# Patient Record
Sex: Male | Born: 1965
Health system: Southern US, Community
[De-identification: ages and names within clinical notes are randomized; demographics above are authoritative.]

## PROBLEM LIST (undated history)

## (undated) DIAGNOSIS — T8859XA Other complications of anesthesia, initial encounter: Secondary | ICD-10-CM

## (undated) DIAGNOSIS — I83813 Varicose veins of bilateral lower extremities with pain: Secondary | ICD-10-CM

## (undated) DIAGNOSIS — Q250 Patent ductus arteriosus: Secondary | ICD-10-CM

## (undated) DIAGNOSIS — F329 Major depressive disorder, single episode, unspecified: Secondary | ICD-10-CM

## (undated) DIAGNOSIS — T4145XA Adverse effect of unspecified anesthetic, initial encounter: Secondary | ICD-10-CM

## (undated) DIAGNOSIS — Z972 Presence of dental prosthetic device (complete) (partial): Secondary | ICD-10-CM

## (undated) DIAGNOSIS — G459 Transient cerebral ischemic attack, unspecified: Secondary | ICD-10-CM

## (undated) DIAGNOSIS — T753XXA Motion sickness, initial encounter: Secondary | ICD-10-CM

## (undated) DIAGNOSIS — F32A Depression, unspecified: Secondary | ICD-10-CM

## (undated) HISTORY — PX: WISDOM TOOTH EXTRACTION: SHX21

---

## 2006-12-22 HISTORY — PX: COLONOSCOPY: SHX174

## 2011-07-23 ENCOUNTER — Ambulatory Visit: Payer: Self-pay | Admitting: Family Medicine

## 2011-07-28 ENCOUNTER — Ambulatory Visit: Payer: Self-pay | Admitting: Family Medicine

## 2012-08-25 ENCOUNTER — Emergency Department: Payer: Self-pay | Admitting: Emergency Medicine

## 2012-08-26 ENCOUNTER — Ambulatory Visit: Payer: Self-pay

## 2012-09-14 IMAGING — US US EXTREM LOW VENOUS*L*
1 series · 17 of 24 positions shown · non-contrast
Comparison: none

REASON FOR EXAM: CR [id number] left leg swelling and pain eval DVT
COMMENTS:

PROCEDURE:     PADAM - PADAM DOPPLER VEINS LT LEG EXTR  - July 28, 2011  [DATE]
RESULT:     The left femoral and popliteal veins are normally compressible.
The waveform patterns are normal and the color flow images are normal. The
response to the augmentation and Valsalva maneuvers is normal.

[Series 1: us extrem low venous*left* · 17 of 43 slices shown]
[im 1/43]
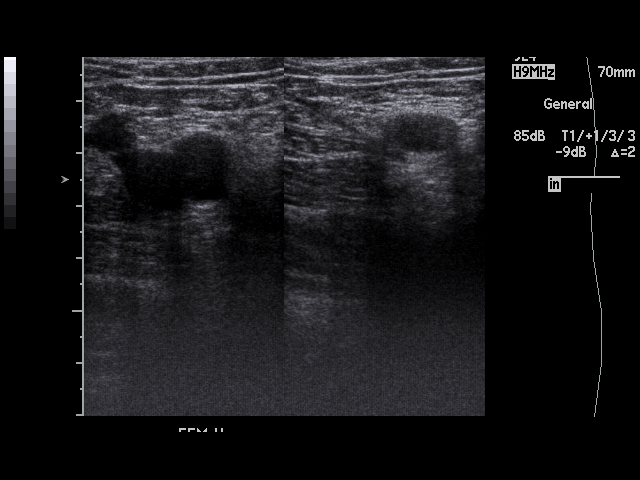
[im 4/43]
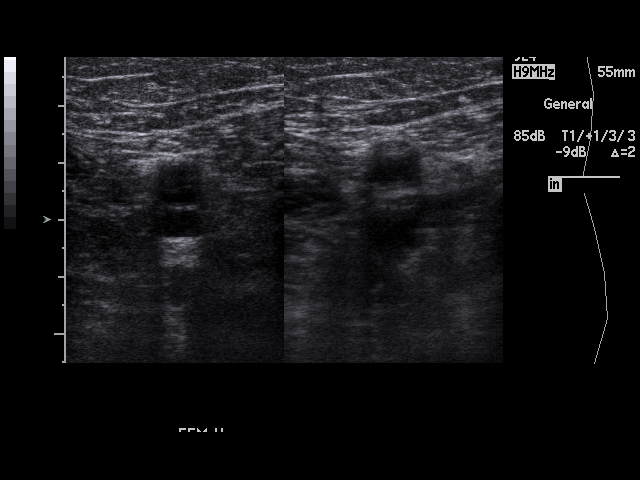
[im 6/43]
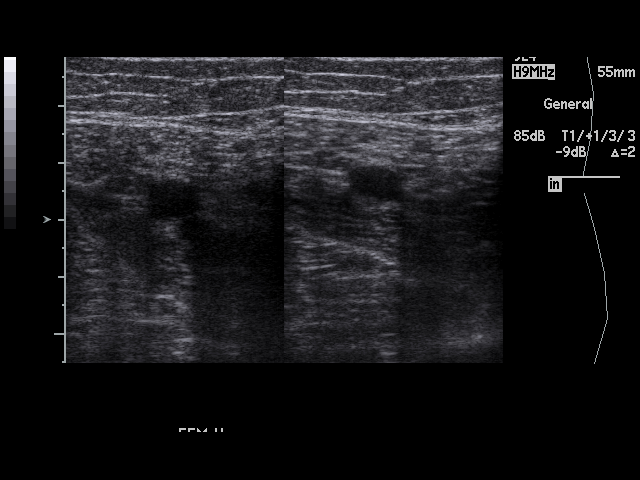
[im 8/43]
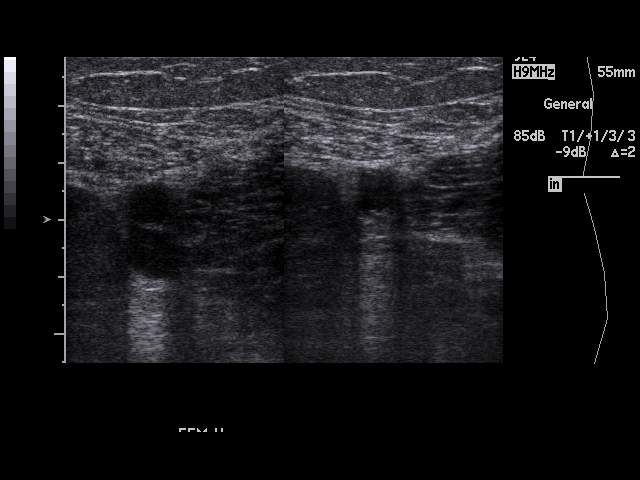
[im 11/43]
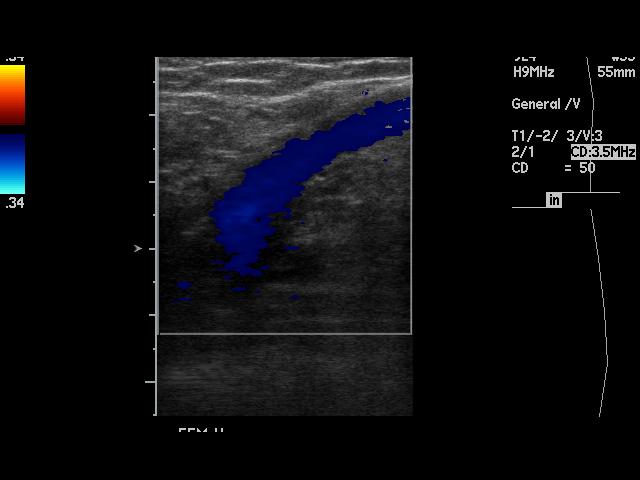
[im 13/43]
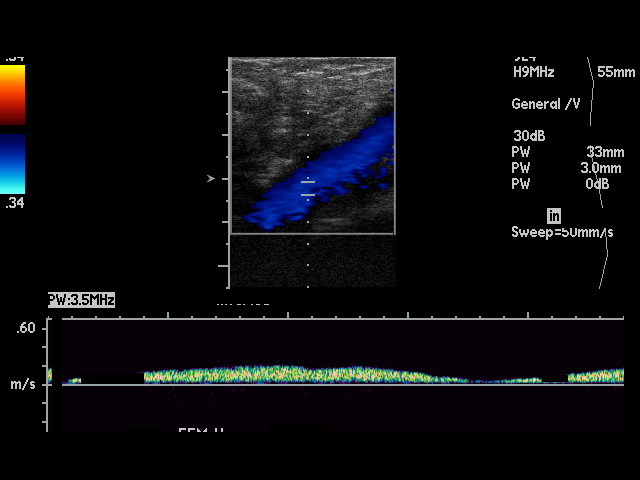
[im 17/43]
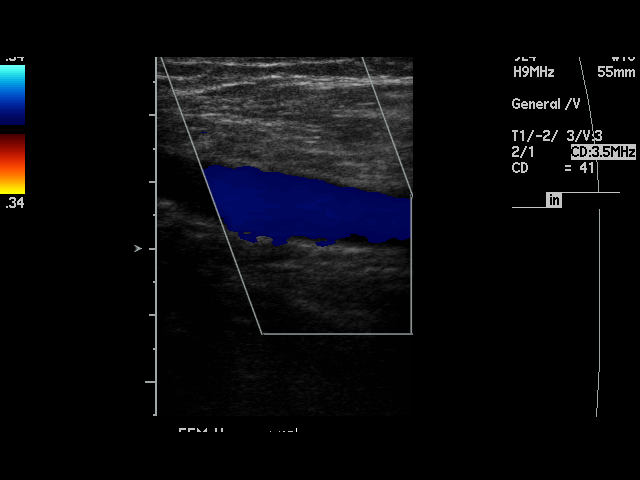
[im 19/43]
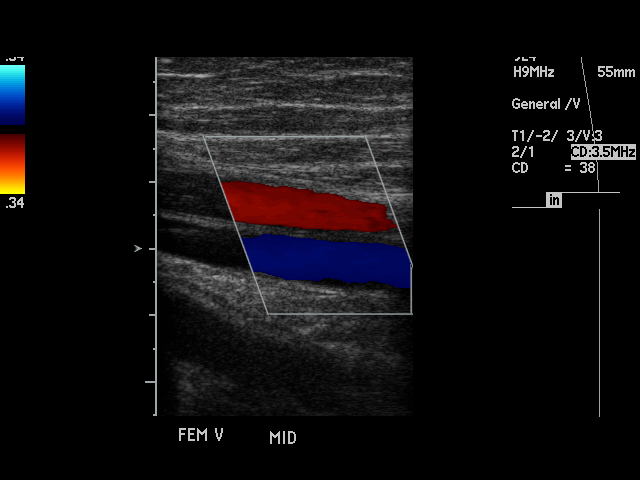
[im 22/43]
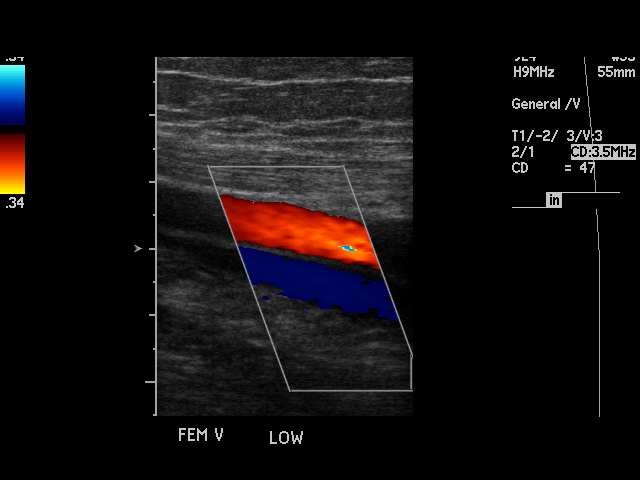
[im 24/43]
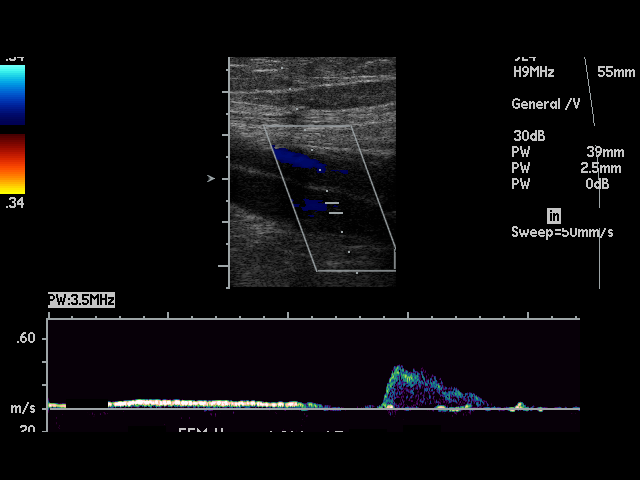
[im 26/43]
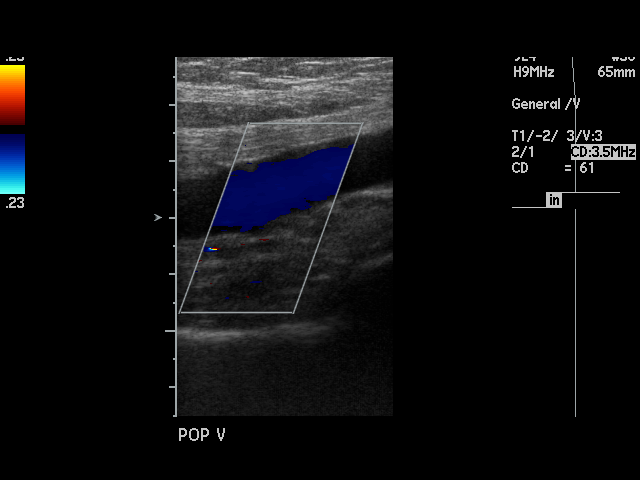
[im 30/43]
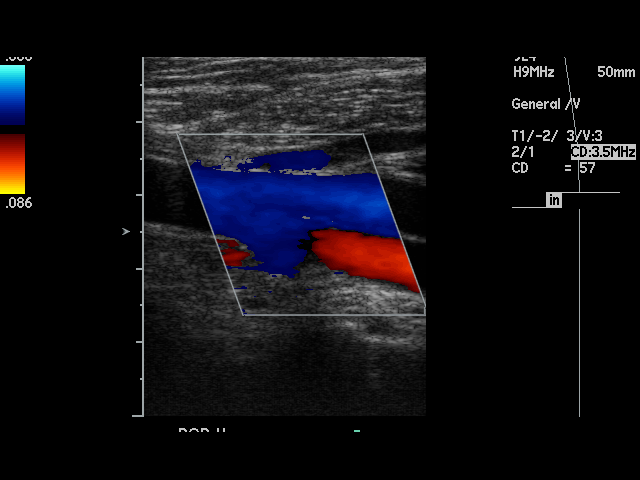
[im 32/43]
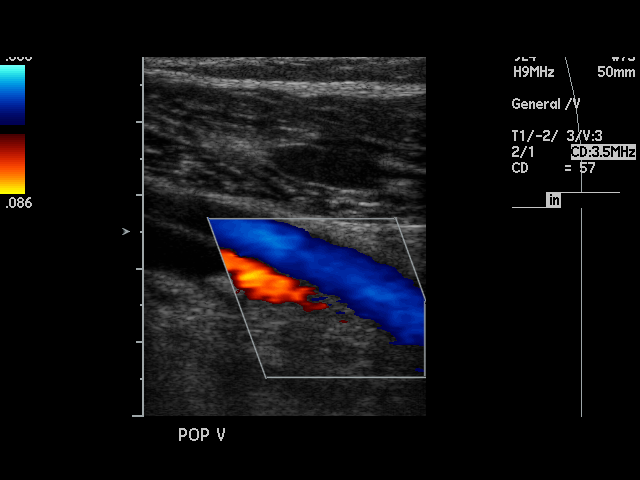
[im 35/43]
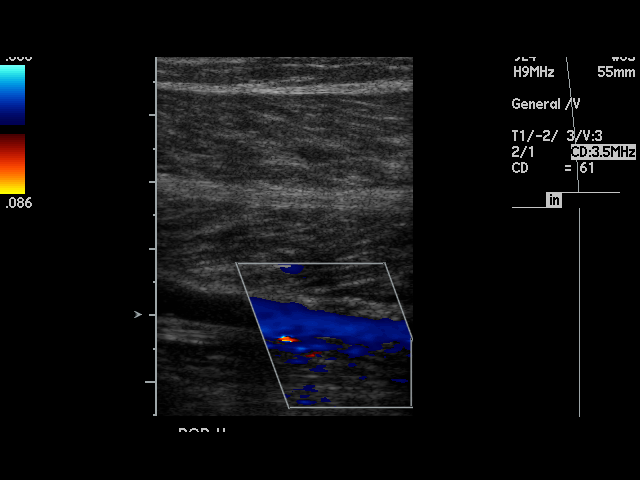
[im 37/43]
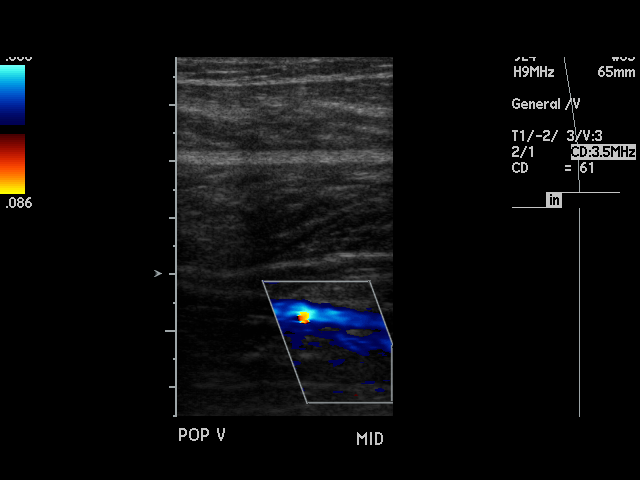
[im 39/43]
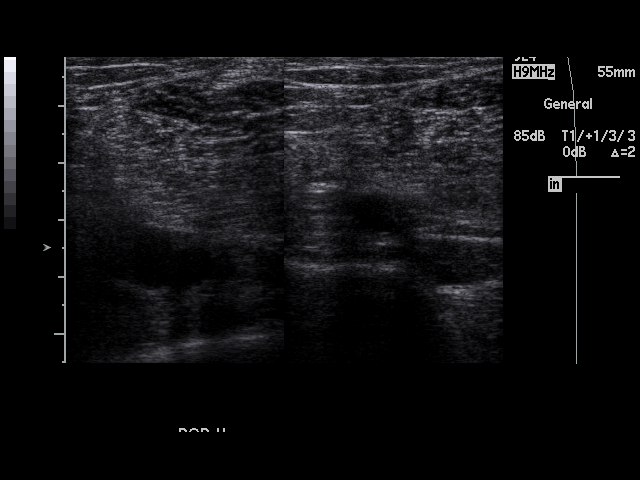
[im 43/43]
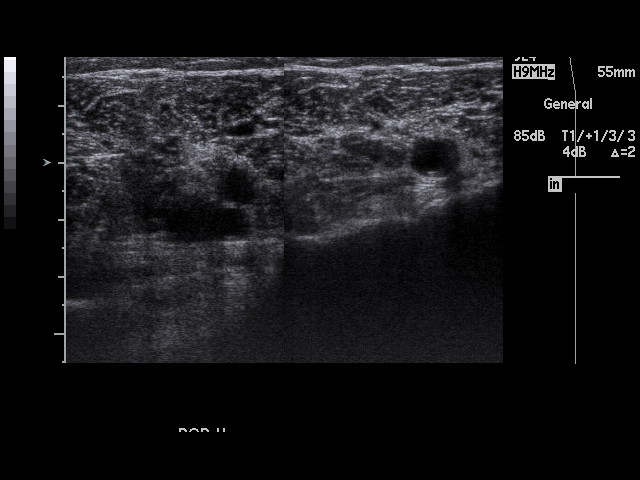

[17 of 24 positions shown; findings below may reference images not displayed]

IMPRESSION: I see no evidence of thrombus within the left femoral or
popliteal veins.

## 2013-09-15 ENCOUNTER — Ambulatory Visit: Payer: Self-pay | Admitting: Emergency Medicine

## 2015-06-06 DIAGNOSIS — F17201 Nicotine dependence, unspecified, in remission: Secondary | ICD-10-CM | POA: Insufficient documentation

## 2015-06-06 DIAGNOSIS — G459 Transient cerebral ischemic attack, unspecified: Secondary | ICD-10-CM | POA: Insufficient documentation

## 2015-06-06 DIAGNOSIS — F32A Depression, unspecified: Secondary | ICD-10-CM | POA: Insufficient documentation

## 2015-06-06 DIAGNOSIS — Q250 Patent ductus arteriosus: Secondary | ICD-10-CM | POA: Insufficient documentation

## 2015-06-06 DIAGNOSIS — I8000 Phlebitis and thrombophlebitis of superficial vessels of unspecified lower extremity: Secondary | ICD-10-CM | POA: Insufficient documentation

## 2015-06-06 DIAGNOSIS — I8393 Asymptomatic varicose veins of bilateral lower extremities: Secondary | ICD-10-CM | POA: Insufficient documentation

## 2015-06-06 DIAGNOSIS — F329 Major depressive disorder, single episode, unspecified: Secondary | ICD-10-CM | POA: Insufficient documentation

## 2015-06-06 HISTORY — DX: Patent ductus arteriosus: Q25.0

## 2015-06-08 ENCOUNTER — Encounter: Payer: Self-pay | Admitting: Internal Medicine

## 2015-06-08 ENCOUNTER — Ambulatory Visit (INDEPENDENT_AMBULATORY_CARE_PROVIDER_SITE_OTHER): Payer: BLUE CROSS/BLUE SHIELD | Admitting: Internal Medicine

## 2015-06-08 VITALS — BP 104/60 | HR 60 | Ht 73.0 in | Wt 192.2 lb

## 2015-06-08 DIAGNOSIS — F329 Major depressive disorder, single episode, unspecified: Secondary | ICD-10-CM | POA: Diagnosis not present

## 2015-06-08 DIAGNOSIS — F32A Depression, unspecified: Secondary | ICD-10-CM

## 2015-06-08 DIAGNOSIS — R5383 Other fatigue: Secondary | ICD-10-CM | POA: Diagnosis not present

## 2015-06-08 MED ORDER — ESCITALOPRAM OXALATE 10 MG PO TABS: 10.0000 mg | ORAL_TABLET | Freq: Every day | ORAL | Status: DC

## 2015-06-08 NOTE — Progress Notes (Signed)
Date:  06/08/2015   Name:  Bob Mills   DOB:  31-Dec-1965   MRN:  [id number]   Chief Complaint: Fatigue HPI - he has been feeling tired and fatigued for a while - at least the past several years.  He has depression and has been seeing a psychologist who thinks maybe he should take medication.  He goes to work but is missing out on social and family activities. He denies suicidal but has little interest.     Review of Systems:  Review of Systems  Constitutional: Positive for fatigue. Negative for fever, appetite change and unexpected weight change.  HENT: Negative for hearing loss and voice change.   Respiratory: Negative for chest tightness, shortness of breath and wheezing.   Cardiovascular: Positive for leg swelling. Negative for chest pain.  Neurological: Negative for dizziness, syncope, speech difficulty, numbness and headaches.  Hematological: Negative for adenopathy.  Psychiatric/Behavioral: Positive for dysphoric mood. Negative for suicidal ideas, hallucinations and confusion. The patient is not nervous/anxious.     Patient Active Problem List   Diagnosis Date Noted  . Compulsive tobacco user syndrome 06/06/2015  . Transient ischemic attack (TIA) 06/06/2015  . Varicose veins of both lower extremities 06/06/2015  . Patent arterial duct 06/06/2015  . Superficial thrombophlebitis of leg 06/06/2015    Prior to Admission medications   Medication Sig Start Date End Date Taking? Authorizing Provider  Multiple Vitamins-Minerals (MULTIVITAMIN GUMMIES ADULT) CHEW Chew 1 tablet by mouth daily.   Yes Historical Provider, MD  doxycycline (VIBRAMYCIN) 100 MG capsule Take by mouth.    Historical Provider, MD  naproxen (NAPROSYN) 500 MG tablet Take 1 tablet by mouth daily.    Historical Provider, MD    No Known Allergies  Past Surgical History  Procedure Laterality Date  . Colonoscopy  2008    History  Substance Use Topics  . Smoking status: Former Smoker    Quit date:  12/22/2013  . Smokeless tobacco: Former Systems developer    Quit date: 11/21/2013  . Alcohol Use: 0.0 oz/week    0 Standard drinks or equivalent per week     Medication list has been reviewed and updated.  Physical Examination:  Physical Exam  Constitutional: He appears well-developed and well-nourished.  Eyes: Conjunctivae are normal.  Neck: Normal range of motion. Neck supple. No thyromegaly present.  Cardiovascular: Normal rate and regular rhythm.   No murmur heard. Pulmonary/Chest: Breath sounds normal. He has no wheezes. He has no rales.  Musculoskeletal: He exhibits edema (varicose veins left lower leg) and tenderness.       Left lower leg: He exhibits swelling (varicose veins).  Lymphadenopathy:    He has no cervical adenopathy.  Neurological: He is alert.  Psychiatric: His speech is normal. Judgment and thought content normal. He is slowed. He exhibits a depressed mood.    BP 104/60 mmHg  Pulse 60  Ht 6\' 1"  (1.854 m)  Wt 192 lb 3.2 oz (87.181 kg)  BMI 25.36 kg/m2  Assessment and Plan: 1. Fatigue due to depression Rule out B12 deficiency - B12  2. Depression Continue counseling Begin SSRI - TSH - escitalopram (LEXAPRO) 10 MG tablet; Take 1 tablet (10 mg total) by mouth daily.  Dispense: 30 tablet; Refill: [employer], MD [facility]  06/08/2015

## 2015-06-09 LAB — TSH: TSH: 2.08 u[IU]/mL (ref 0.450–4.500)

## 2015-06-09 LAB — VITAMIN B12: VITAMIN B 12: 390 pg/mL (ref 211–946)

## 2015-07-17 ENCOUNTER — Encounter: Payer: Self-pay | Admitting: Internal Medicine

## 2015-07-18 ENCOUNTER — Ambulatory Visit: Payer: BLUE CROSS/BLUE SHIELD | Admitting: Internal Medicine

## 2015-09-10 ENCOUNTER — Ambulatory Visit (INDEPENDENT_AMBULATORY_CARE_PROVIDER_SITE_OTHER): Payer: BLUE CROSS/BLUE SHIELD | Admitting: Internal Medicine

## 2015-09-10 ENCOUNTER — Encounter: Payer: Self-pay | Admitting: Internal Medicine

## 2015-09-10 VITALS — BP 104/68 | HR 60 | Ht 73.0 in | Wt 198.0 lb

## 2015-09-10 DIAGNOSIS — F329 Major depressive disorder, single episode, unspecified: Secondary | ICD-10-CM

## 2015-09-10 DIAGNOSIS — Z0000 Encounter for general adult medical examination without abnormal findings: Secondary | ICD-10-CM | POA: Diagnosis not present

## 2015-09-10 DIAGNOSIS — G459 Transient cerebral ischemic attack, unspecified: Secondary | ICD-10-CM

## 2015-09-10 DIAGNOSIS — Z23 Encounter for immunization: Secondary | ICD-10-CM

## 2015-09-10 DIAGNOSIS — F17200 Nicotine dependence, unspecified, uncomplicated: Secondary | ICD-10-CM

## 2015-09-10 DIAGNOSIS — Z125 Encounter for screening for malignant neoplasm of prostate: Secondary | ICD-10-CM

## 2015-09-10 DIAGNOSIS — I8393 Asymptomatic varicose veins of bilateral lower extremities: Secondary | ICD-10-CM | POA: Diagnosis not present

## 2015-09-10 DIAGNOSIS — F32A Depression, unspecified: Secondary | ICD-10-CM

## 2015-09-10 LAB — POCT URINALYSIS DIPSTICK
BILIRUBIN UA: NEGATIVE
GLUCOSE UA: NEGATIVE
KETONES UA: NEGATIVE
Leukocytes, UA: NEGATIVE
NITRITE UA: NEGATIVE
PH UA: 5
Protein, UA: NEGATIVE
RBC UA: NEGATIVE
SPEC GRAV UA: 1.01
Urobilinogen, UA: 0.2

## 2015-09-10 MED ORDER — ESCITALOPRAM OXALATE 10 MG PO TABS: 10.0000 mg | ORAL_TABLET | Freq: Every day | ORAL | Status: DC

## 2015-09-10 NOTE — Progress Notes (Signed)
Date:  09/10/2015   Name:  Bob Mills   DOB:  06-04-1966   MRN:  [id number]   Chief Complaint: Annual Exam Bob Mills is a 49 y.o. male who presents today for his Complete Annual Exam. He feels well. He reports exercising none. He reports he is sleeping well. He still has issues with his varicose veins. Is contemplating surgery. Depression - doing well on current medications. He noticed when he was out of medication for one week he became more anxious and irritable.   Review of Systems:  Review of Systems  Constitutional: Negative for fever, chills, fatigue and unexpected weight change.  HENT: Negative for hearing loss, tinnitus and trouble swallowing.   Eyes: Negative for visual disturbance.  Respiratory: Negative for cough, shortness of breath and wheezing.   Cardiovascular: Positive for leg swelling. Negative for chest pain and palpitations.  Gastrointestinal: Negative for abdominal pain and constipation.  Genitourinary: Negative for dysuria, frequency, hematuria and difficulty urinating.  Musculoskeletal: Negative for myalgias, back pain and gait problem.       Tender varicose veins of the left lower extremity  Skin: Negative for color change, rash and wound.  Neurological: Negative for dizziness, tremors, syncope and headaches.  Hematological: Negative for adenopathy. Does not bruise/bleed easily.  Psychiatric/Behavioral: Negative for dysphoric mood and decreased concentration. The patient is not nervous/anxious and is not hyperactive.     Patient Active Problem List   Diagnosis Date Noted  . Compulsive tobacco user syndrome 06/06/2015  . Transient ischemic attack (TIA) 06/06/2015  . Varicose veins of both lower extremities 06/06/2015  . Patent arterial duct 06/06/2015  . Superficial thrombophlebitis of leg 06/06/2015  . Depression 06/06/2015    Prior to Admission medications   Medication Sig Start Date End Date Taking? Authorizing Provider  aspirin 81 MG  tablet Take 81 mg by mouth daily.   Yes Historical Provider, MD  escitalopram (LEXAPRO) 10 MG tablet Take 1 tablet (10 mg total) by mouth daily. 06/08/15  Yes Glean Hess, MD  Multiple Vitamins-Minerals (MULTIVITAMIN GUMMIES ADULT) CHEW Chew 1 tablet by mouth daily.   Yes Historical Provider, MD    No Known Allergies  Past Surgical History  Procedure Laterality Date  . Colonoscopy  2008    Social History  Substance Use Topics  . Smoking status: Former Smoker    Quit date: 12/22/2013  . Smokeless tobacco: Former Systems developer    Quit date: 11/21/2013  . Alcohol Use: 0.0 oz/week    0 Standard drinks or equivalent per week     Medication list has been reviewed and updated.  Physical Examination:  Physical Exam  Constitutional: He is oriented to person, place, and time. He appears well-developed and well-nourished. No distress.  HENT:  Head: Normocephalic and atraumatic.  Eyes: Conjunctivae are normal. Pupils are equal, round, and reactive to light. Right eye exhibits no discharge. Left eye exhibits no discharge. No scleral icterus.  Neck: Normal range of motion. Neck supple. No thyromegaly present.  Cardiovascular: Normal rate, regular rhythm and normal heart sounds.   Pulmonary/Chest: Effort normal and breath sounds normal. No respiratory distress. He has no rales.  Abdominal: Soft. Bowel sounds are normal. He exhibits no mass. There is no tenderness.  Musculoskeletal: Normal range of motion.  Extensive varicose veins with tenderness and mild swelling along the left lower extremity from the mid thigh down to the ankle. No infection or cord is noted.  Lymphadenopathy:    He has no cervical adenopathy.  He has no axillary adenopathy.  Neurological: He is alert and oriented to person, place, and time. He has normal reflexes.  Skin: Skin is warm and dry. No rash noted.  Psychiatric: He has a normal mood and affect. His behavior is normal. Thought content normal.    BP 104/68 mmHg   Pulse 60  Ht 6\' 1"  (1.854 m)  Wt 198 lb (89.812 kg)  BMI 26.13 kg/m2  Assessment and Plan: 1. Annual physical exam - POCT urinalysis dipstick  2. Prostate cancer screening - PSA  3. Depression Doing well on medication; recently failed a trial off so will continue indefinitely - escitalopram (LEXAPRO) 10 MG tablet; Take 1 tablet (10 mg total) by mouth daily.  Dispense: 30 tablet; Refill: 11  4. Compulsive tobacco user syndrome Remains tobacco free  5. Varicose veins of both lower extremities Patient is encouraged to consider surgery  6. Transient cerebral ischemia, unspecified transient cerebral ischemia type Stable, no recurrent symptoms, recommend continuing aspirin - CBC with Differential/Platelet - Comprehensive metabolic panel - Lipid panel  7. Flu vaccine need - Flu Vaccine QUAD 36+ mos PF IM (Fluarix & Fluzone Quad PF)   Halina Maidens, MD [facility]  09/10/2015

## 2015-09-11 LAB — CBC WITH DIFFERENTIAL/PLATELET
BASOS ABS: 0 10*3/uL (ref 0.0–0.2)
Basos: 1 %
EOS (ABSOLUTE): 0.1 10*3/uL (ref 0.0–0.4)
EOS: 2 %
HEMATOCRIT: 39 % (ref 37.5–51.0)
Hemoglobin: 13.2 g/dL (ref 12.6–17.7)
IMMATURE GRANS (ABS): 0 10*3/uL (ref 0.0–0.1)
Immature Granulocytes: 0 %
Lymphocytes Absolute: 2.7 10*3/uL (ref 0.7–3.1)
Lymphs: 35 %
MCH: 29.9 pg (ref 26.6–33.0)
MCHC: 33.8 g/dL (ref 31.5–35.7)
MCV: 88 fL (ref 79–97)
MONOCYTES: 9 %
Monocytes Absolute: 0.7 10*3/uL (ref 0.1–0.9)
Neutrophils Absolute: 4.2 10*3/uL (ref 1.4–7.0)
Neutrophils: 53 %
Platelets: 212 10*3/uL (ref 150–379)
RBC: 4.42 x10E6/uL (ref 4.14–5.80)
RDW: 13.1 % (ref 12.3–15.4)
WBC: 7.8 10*3/uL (ref 3.4–10.8)

## 2015-09-11 LAB — COMPREHENSIVE METABOLIC PANEL
ALT: 17 IU/L (ref 0–44)
AST: 17 IU/L (ref 0–40)
Albumin/Globulin Ratio: 1.6 (ref 1.1–2.5)
Albumin: 4.3 g/dL (ref 3.5–5.5)
Alkaline Phosphatase: 65 IU/L (ref 39–117)
BUN/Creatinine Ratio: 16 (ref 9–20)
BUN: 10 mg/dL (ref 6–24)
Bilirubin Total: 0.2 mg/dL (ref 0.0–1.2)
CALCIUM: 9.4 mg/dL (ref 8.7–10.2)
CO2: 25 mmol/L (ref 18–29)
CREATININE: 0.64 mg/dL — AB (ref 0.76–1.27)
Chloride: 99 mmol/L (ref 97–108)
GFR calc Af Amer: 135 mL/min/{1.73_m2} (ref >59)
GFR, EST NON AFRICAN AMERICAN: 117 mL/min/{1.73_m2} (ref >59)
GLUCOSE: 69 mg/dL (ref 65–99)
Globulin, Total: 2.7 g/dL (ref 1.5–4.5)
Potassium: 4.2 mmol/L (ref 3.5–5.2)
Sodium: 138 mmol/L (ref 134–144)
TOTAL PROTEIN: 7 g/dL (ref 6.0–8.5)

## 2015-09-11 LAB — LIPID PANEL
CHOL/HDL RATIO: 2.7 ratio (ref 0.0–5.0)
CHOLESTEROL TOTAL: 166 mg/dL (ref 100–199)
HDL: 62 mg/dL (ref >39)
LDL CALC: 79 mg/dL (ref 0–99)
Triglycerides: 127 mg/dL (ref 0–149)
VLDL Cholesterol Cal: 25 mg/dL (ref 5–40)

## 2015-09-11 LAB — PSA: Prostate Specific Ag, Serum: 0.3 ng/mL (ref 0.0–4.0)

## 2015-10-12 ENCOUNTER — Ambulatory Visit (INDEPENDENT_AMBULATORY_CARE_PROVIDER_SITE_OTHER): Payer: BLUE CROSS/BLUE SHIELD | Admitting: Internal Medicine

## 2015-10-12 ENCOUNTER — Ambulatory Visit
Admission: EM | Admit: 2015-10-12 | Discharge: 2015-10-12 | Disposition: A | Discharge status: Home / Self Care | Payer: BLUE CROSS/BLUE SHIELD | Attending: Emergency Medicine | Admitting: Emergency Medicine

## 2015-10-12 ENCOUNTER — Encounter: Payer: Self-pay | Admitting: Internal Medicine

## 2015-10-12 VITALS — BP 110/70 | HR 68 | Ht 73.0 in | Wt 199.4 lb

## 2015-10-12 DIAGNOSIS — I83892 Varicose veins of left lower extremities with other complications: Secondary | ICD-10-CM

## 2015-10-12 HISTORY — DX: Transient cerebral ischemic attack, unspecified: G45.9

## 2015-10-12 NOTE — Discharge Instructions (Signed)
Avoid scratching same area. Elevate legs. Use compression stockings as discussed.   Follow up with your primary care physician in 2-3 days for follow up. Return to Urgent care for new or worsening concerns.   Bleeding Varicose Veins Varicose veins are veins that have become enlarged and twisted. Valves in the veins help return blood from the leg to the heart. If these valves are damaged, blood flows backward and backs up into the veins in the leg near the skin. This causes the veins to become larger because of increased pressure within them. Sometimes these veins bleed. CAUSES  Factors that can lead to bleeding varicose veins include:  Thinning of the skin that covers the veins. This skin is stretched as the veins enlarge.  Weak and thinning walls of the varicose veins. These thin walls are part of the reason why blood is not flowing normally to the heart.  Having high pressure in the veins. This high pressure occurs because the blood is not flowing freely back up to the heart.  Injury. Even a small injury to a varicose vein can cause bleeding.  Open wounds. A sore may develop near a varicose vein and not heal. This makes bleeding more likely.  Taking medicine that thins the blood. These medicines may include aspirin, anti-inflammatory medicine, and other blood thinners. SIGNS AND SYMPTOMS  If bleeding is on the outside surface of the skin, blood can be seen. Sometimes, the bleeding stays under the skin. If this happens, the blue or purple area will spread beyond the vein. This discoloration may be visible. DIAGNOSIS  To decide if you have a bleeding varicose vein, your health care provider may:  Ask about your symptoms. This will include when you first saw bleeding.  Ask about how long you have had varicose veins and if they cause you problems.  Ask about your overall health.  Ask about possible causes, such as recent cuts or if the area near the varicose veins was bumped or  injured.  Examine the skin or leg that concerns you. Your health care provider will probably feel the veins.  Order imaging tests. These create detailed pictures of the veins. TREATMENT  The first goal of treating bleeding varicose veins is to stop the bleeding. Then, the aim is to keep any bleeding from happening again. Treatment will depend on the cause of the bleeding and how bad it is. Ask your health care provider about what would be best for you. Options include:  Raising (elevating) your leg. Lie down with your leg propped up on a pillow or cushion. Your foot should be above the level of your heart.  Applying pressure to the spot that is bleeding. The bleeding should stop in a short time.  Wearing elastic stockings that "compress" your legs (compression stockings). An elastic bandage may do the same thing.  Applying an antibiotic cream on sores that are not healing.  Closing off or surgically removing the bleeding varicose veins with one of the following:  Sclerotherapy. A solution is injected into the vein to close it off.  Laser treatment. A laser is used to heat the vein to close it off.  Radiofrequency vein ablation. An electrical current produced by radio waves is used to close off the vein.  Phlebectomy. The vein is surgically removed through small incisions made over the varicose vein.  Vein ligation and stripping. The vein is surgically removed through incisions made over the varicose vein after the vein has been tied (ligated). HOME  CARE INSTRUCTIONS   Apply any creams that your health care provider prescribed. Follow the directions carefully.  Wear compression stockings or any wraps as directed by your health care provider. Make sure you know:  If you should wear them every day.  How long you should wear them.  If veins were removed or closed, a bandage (dressing) will probably cover the area. Make sure you know:  How often the dressing should be  changed.  Whether the area can get wet.  When you can leave the skin uncovered.  Check your skin every day. Look for new sores and signs of bleeding.  To prevent future bleeding:  Use extra care in situations where you could cut your legs, such as when shaving or gardening.  Try to keep your legs elevated as much as possible. Lie down when you can. SEEK MEDICAL CARE IF:   Your veins continue to bleed.  You develop new sores near your varicose veins.  You have a sore that does not heal or gets bigger.  You have increased pain in your leg.  The area around a varicose vein becomes warm, red, or tender to the touch.  You notice a yellowish fluid that smells bad coming from a spot where there was bleeding.  You have a fever. SEEK IMMEDIATE MEDICAL CARE IF:   You have chest pain or difficulty breathing.  You have severe leg pain.   This information is not intended to replace advice given to you by your health care provider. Make sure you discuss any questions you have with your health care provider.   Document Released: 04/26/2009 Document Revised: 12/29/2014 Document Reviewed: 04/11/2014 Elsevier Interactive Patient Education [employer].

## 2015-10-12 NOTE — ED Notes (Signed)
Ambulatory to treatment room.  Bandage applied PTA.  No bleeding noted through bandage at this time.

## 2015-10-12 NOTE — ED Provider Notes (Signed)
[facility] Emergency Department Provider Note  ____________________________________________  Time seen: Approximately 4:26 PM  I have reviewed the triage vital signs and the nursing notes.   HISTORY  Chief Complaint Abrasion    HPI Bob Mills is a 49 y.o. male presents for complaint of left lower leg intermittent bleeding. Patient reports that he has chronic varicose veins and states Wednesday afternoon his leg was itching so he scratched his leg which accidentally scratched varicose pain. Patient states that it did begin to bleed however he applied pressure which stopped the bleeding. Patient reports that yesterday when he got out of the shower he forgot about the varicose vein and rubbed his leg dry with a towel scratching off the scab and cause the area to bleed mildly. Patient states that the area and then again stopped with pressure dressing. Patient reports that today he scratched left lower leg again which again caused the area to bleed. Patient states that he did follow up with his primary care physician today who then referred him to the urgent care. Denies leg or calf pain.   Patient states that the bleeding this afternoon occurred at approximately 12 noon. States the bleeding slowed down with him applying pressure. States his primary care physician's office put a dressing on the area which stopped the bleeding. Patient denies fall or trauma. Denies pain.States his primary doctor was concerned the area may need a stitch or cautery which is why he was referred to urgent care. Denies chest pain, shortness of breath, dizziness, fever, leg pain. States he occasionally has bilaterally leg swelling when he stands on his feet for long periods of time. Denies recent leg swelling.   Denies recent changes in medications. Denies blood thinners or anticoagulants. Reports occasionally takes daily 81 mg aspirin. Denies frequent NSAIDS use. Denies frequent ETOH  consumption.    Past Medical History  Diagnosis Date  . TIA (transient ischemic attack)     Patient Active Problem List   Diagnosis Date Noted  . Compulsive tobacco user syndrome 06/06/2015  . Transient ischemic attack (TIA) 06/06/2015  . Varicose veins of both lower extremities 06/06/2015  . Patent arterial duct 06/06/2015  . Superficial thrombophlebitis of leg 06/06/2015  . Depression 06/06/2015    Past Surgical History  Procedure Laterality Date  . Colonoscopy  2008    Current Outpatient Rx  Name  Route  Sig  Dispense  Refill  . aspirin 81 MG tablet   Oral   Take 81 mg by mouth daily.         Marland Kitchen escitalopram (LEXAPRO) 10 MG tablet   Oral   Take 1 tablet (10 mg total) by mouth daily.   30 tablet   11   . Multiple Vitamins-Minerals (MULTIVITAMIN GUMMIES ADULT) CHEW   Oral   Chew 1 tablet by mouth daily.           Allergies Review of patient's allergies indicates no known allergies.  Family History  Problem Relation Age of Onset  . Anemia Maternal Aunt   . Parkinson's disease Father     Social History Social History  Substance Use Topics  . Smoking status: Former Smoker    Quit date: 12/22/2013  . Smokeless tobacco: Former Systems developer    Quit date: 11/21/2013  . Alcohol Use: No    Review of Systems Constitutional: No fever/chills Eyes: No visual changes. ENT: No sore throat. Cardiovascular: Denies chest pain. Respiratory: Denies shortness of breath. Gastrointestinal: No abdominal pain.  No  nausea, no vomiting.  No diarrhea.  No constipation. Genitourinary: Negative for dysuria. Musculoskeletal: Negative for back pain. Skin: Negative for rash. Left lower leg bleeding.  Neurological: Negative for headaches, focal weakness or numbness.  10-point ROS otherwise negative.  ____________________________________________   PHYSICAL EXAM:  VITAL SIGNS: ED Triage Vitals  Enc Vitals Group     BP 10/12/15 1529 119/73 mmHg     Pulse Rate 10/12/15 1529  64     Resp 10/12/15 1529 16     Temp 10/12/15 1529 98.1 F (36.7 C)     Temp Source 10/12/15 1529 Oral     SpO2 10/12/15 1529 100 %     Weight --      Height --      Head Cir --      Peak Flow --      Pain Score 10/12/15 1532 0     Pain Loc --      Pain Edu? --      Excl. in [place]? --     Constitutional: Alert and oriented. Well appearing and in no acute distress. Eyes: Conjunctivae are normal. PERRL. EOMI. Head: Atraumatic.  Nose: No congestion/rhinnorhea.  Mouth/Throat: Mucous membranes are moist.  Oropharynx non-erythematous. Neck: No stridor.  No cervical spine tenderness to palpation. Hematological/Lymphatic/Immunilogical: No cervical lymphadenopathy. Cardiovascular: Normal rate, regular rhythm. Grossly normal heart sounds.  Good peripheral circulation. Respiratory: Normal respiratory effort.  No retractions. Lungs CTAB. Gastrointestinal: Soft and nontender. No distention. Normal Bowel sounds.   No CVA tenderness. Musculoskeletal: No lower or upper extremity tenderness nor edema.  No joint effusions. Bilateral pedal pulses equal and easily palpated. No calf tenderness bilaterally. No lower leg edema. Multiple superficial varicose veins bilaterally.  Neurologic:  Normal speech and language. No gross focal neurologic deficits are appreciated. No gait instability. Skin:  Skin is warm, dry and intact. No rash noted. Except: Left lower leg multiple superficial varicose veins present with left lateral lower leg superficial varicose vein with area of clotting, area small consistent with head of pen, no open wound, no active bleeding. Nontender. No erythema, no surrounding erythema. No edema.  Psychiatric: Mood and affect are normal. Speech and behavior are normal.  ____________________________________________   LABS (all labs ordered are listed, but only abnormal results are displayed)  Labs Reviewed - No data to display  PROCEDURES  Procedure(s) performed:  Procedure(s)  performed:  Procedure explained and verbal consent obtained. Consent: Verbal consent obtained. Written consent not obtained. Risks and benefits: risks, benefits and alternatives were discussed Patient identity confirmed: verbally with patient and hospital-assigned identification number  Consent given by: patient   Left lower lateral lower leg superficial varicose veins present with left lateral lower varicose vein with superficial pinpoint area clotting with fresh scabbing, consistent with patient report of area that was bleeding. No active bleeding at this time. As patient reports that this area has started bleeding again over last few days after scratching. Will use silver nitrate and perform superficial cauterization. Silver nitrate utilized. Patient tolerated well. No active bleeding. We'll continue to monitor in room to ensure no continued bleeding.    INITIAL IMPRESSION / ASSESSMENT AND PLAN / ED COURSE  Pertinent labs & imaging results that were available during my care of the patient were reviewed by me and considered in my medical decision making (see chart for details).  Very well appearing. No acute distress. Presents for bleeding superficial varicose vein after he accidentally scratched the area. No active bleeding at this time. Discussed  patient and plan of care with Dr. Vanita Panda. As patient only occasionally takes 81 mg baby aspirin, not on any blood thinners and bleeding was initiated by injury from patient scratching, do not feel that anticoagulant studies indicated at this time. Area cauterized with silver nitrate. Patient tolerated well. Monitored in urgent care for 30 minutes post cautery with patient walking and moving without any rebleeding. Patient denies any pain. Bilateral lower extremities nontender. Dressing applied. We'll also apply Ace bandage wrap to left lower leg for compression as well as to prevent area from being scratched. Discussed with patient to use TED stockings  as needed for varicose veins, patient reports he has these at home and occasionally uses them. Discussed monitoring the area for any signs of infection including redness, swelling, drainage, tenderness or worsening concerns. Also counseled to follow-up closely with his primary care physician.Discussed follow up with Primary care physician this week. Discussed follow up and return parameters including no resolution or any worsening concerns. Patient verbalized understanding and agreed to plan.   ____________________________________________   FINAL CLINICAL IMPRESSION(S) / ED DIAGNOSES  Final diagnoses:  Bleeding from varicose vein, left       Marylene Land, NP 10/12/15 2212

## 2015-10-12 NOTE — Progress Notes (Signed)
Date:  10/12/2015   Name:  Bob Mills   DOB:  [birth year]   MRN:  [id number]   Chief Complaint: Varicose Veins  Patient has a varicose vein on his left lower leg that continues to bleed. He thinks he scratched the area and started the problem. When necessary we will stop bleeding but then it begins again is here today to have it addressed.   Review of Systems  Cardiovascular: Positive for leg swelling (varicose veins).  Hematological: Bruises/bleeds easily.    Patient Active Problem List   Diagnosis Date Noted  . Compulsive tobacco user syndrome 06/06/2015  . Transient ischemic attack (TIA) 06/06/2015  . Varicose veins of both lower extremities 06/06/2015  . Patent arterial duct 06/06/2015  . Superficial thrombophlebitis of leg 06/06/2015  . Depression 06/06/2015    Prior to Admission medications   Medication Sig Start Date End Date Taking? Authorizing Provider  escitalopram (LEXAPRO) 10 MG tablet Take 1 tablet (10 mg total) by mouth daily. 09/10/15  Yes Glean Hess, MD  Multiple Vitamins-Minerals (MULTIVITAMIN GUMMIES ADULT) CHEW Chew 1 tablet by mouth daily.   Yes Historical Provider, MD  aspirin 81 MG tablet Take 81 mg by mouth daily.    Historical Provider, MD    No Known Allergies  Past Surgical History  Procedure Laterality Date  . Colonoscopy  2008    Social History  Substance Use Topics  . Smoking status: Former Smoker    Quit date: 12/22/2013  . Smokeless tobacco: Former Systems developer    Quit date: 11/21/2013  . Alcohol Use: 0.0 oz/week    0 Standard drinks or equivalent per week     Medication list has been reviewed and updated.   Physical Exam  Constitutional: He appears well-developed and well-nourished. No distress.  Pulmonary/Chest: Effort normal.  Skin:     Psychiatric: He has a normal mood and affect.    BP 110/70 mmHg  Pulse 68  Ht 6\' 1"  (1.854 m)  Wt 199 lb 6.4 oz (90.447 kg)  BMI 26.31 kg/m2  Assessment and Plan: 1. Bleeding from  varicose vein, left Pressure dressing applied to the bleeding site Patient sent downstairs to urgent care for further management   Halina Maidens, MD [facility]  10/12/2015

## 2015-10-12 NOTE — ED Notes (Signed)
Reports having vericose veins and scratched it Wednesday and it bled.  Was stopped by pressure.  Both yesterday and today area was scratched and bleeding was controlled by pressure bandages.  Saw his GP and was advised to come to [facility] to have it looked at.

## 2015-10-26 ENCOUNTER — Encounter: Payer: Self-pay | Admitting: Emergency Medicine

## 2015-10-26 ENCOUNTER — Ambulatory Visit
Admission: EM | Admit: 2015-10-26 | Discharge: 2015-10-26 | Disposition: A | Discharge status: Home / Self Care | Payer: BLUE CROSS/BLUE SHIELD | Attending: Family Medicine | Admitting: Family Medicine

## 2015-10-26 DIAGNOSIS — S81812A Laceration without foreign body, left lower leg, initial encounter: Secondary | ICD-10-CM | POA: Diagnosis not present

## 2015-10-26 MED ORDER — TETANUS-DIPHTH-ACELL PERTUSSIS 5-2.5-18.5 LF-MCG/0.5 IM SUSP
0.5000 mL | Freq: Once | INTRAMUSCULAR | Status: AC
  Administered 2015-10-26: 0.5 mL via INTRAMUSCULAR

## 2015-10-26 NOTE — Discharge Instructions (Signed)
Laceration Care, Adult  A laceration is a cut that goes through all layers of the skin. The cut also goes into the tissue that is right under the skin. Some cuts heal on their own. Others need to be closed with stitches (sutures), staples, skin adhesive strips, or wound glue. Taking care of your cut lowers your risk of infection and helps your cut to heal better.  HOW TO TAKE CARE OF YOUR CUT  For stitches or staples:  · Keep the wound clean and dry.  · If you were given a bandage (dressing), you should change it at least one time per day or as told by your doctor. You should also change it if it gets wet or dirty.  · Keep the wound completely dry for the first 24 hours or as told by your doctor. After that time, you may take a shower or a bath. However, make sure that the wound is not soaked in water until after the stitches or staples have been removed.  · Clean the wound one time each day or as told by your doctor:    Wash the wound with soap and water.    Rinse the wound with water until all of the soap comes off.    Pat the wound dry with a clean towel. Do not rub the wound.  · After you clean the wound, put a thin layer of antibiotic ointment on it as told by your doctor. This ointment:    Helps to prevent infection.    Keeps the bandage from sticking to the wound.  · Have your stitches or staples removed as told by your doctor.  If your doctor used skin adhesive strips:   · Keep the wound clean and dry.  · If you were given a bandage, you should change it at least one time per day or as told by your doctor. You should also change it if it gets dirty or wet.  · Do not get the skin adhesive strips wet. You can take a shower or a bath, but be careful to keep the wound dry.  · If the wound gets wet, pat it dry with a clean towel. Do not rub the wound.  · Skin adhesive strips fall off on their own. You can trim the strips as the wound heals. Do not remove any strips that are still stuck to the wound. They will  fall off after a while.  If your doctor used wound glue:  · Try to keep your wound dry, but you may briefly wet it in the shower or bath. Do not soak the wound in water, such as by swimming.  · After you take a shower or a bath, gently pat the wound dry with a clean towel. Do not rub the wound.  · Do not do any activities that will make you really sweaty until the skin glue has fallen off on its own.  · Do not apply liquid, cream, or ointment medicine to your wound while the skin glue is still on.  · If you were given a bandage, you should change it at least one time per day or as told by your doctor. You should also change it if it gets dirty or wet.  · If a bandage is placed over the wound, do not let the tape for the bandage touch the skin glue.  · Do not pick at the glue. The skin glue usually stays on for 5-10 days. Then, it   or when wound glue stays in place and the wound is healed. Make sure to wear a sunscreen of at least 30 SPF.  Take over-the-counter and prescription medicines only as told by your doctor.  If you were given antibiotic medicine or ointment, take or apply it as told by your doctor. Do not stop using the antibiotic even if your wound is getting better.  Do not scratch or pick at the wound.  Keep all follow-up visits as told by your doctor. This is important.  Check your wound every day for signs of infection. Watch for:  Redness, swelling, or pain.  Fluid, blood, or pus.  Raise (elevate) the injured area above the level of your heart while you are sitting or lying down, if possible. GET HELP IF:  You got a tetanus shot and you have any of these problems at the injection site:  Swelling.  Very bad pain.  Redness.  Bleeding.  You have a fever.  A wound that was  closed breaks open.  You notice a bad smell coming from your wound or your bandage.  You notice something coming out of the wound, such as wood or glass.  Medicine does not help your pain.  You have more redness, swelling, or pain at the site of your wound.  You have fluid, blood, or pus coming from your wound.  You notice a change in the color of your skin near your wound.  You need to change the bandage often because fluid, blood, or pus is coming from the wound.  You start to have a new rash.  You start to have numbness around the wound. GET HELP RIGHT AWAY IF:  You have very bad swelling around the wound.  Your pain suddenly gets worse and is very bad.  You notice painful lumps near the wound or on skin that is anywhere on your body.  You have a red streak going away from your wound.  The wound is on your hand or foot and you cannot move a finger or toe like you usually can.  The wound is on your hand or foot and you notice that your fingers or toes look pale or bluish.   This information is not intended to replace advice given to you by your health care provider. Make sure you discuss any questions you have with your health care provider.   Document Released: 05/26/2008 Document Revised: 04/24/2015 Document Reviewed: 12/04/2014 Elsevier Interactive Patient Education 2016 [place].   Follow up 7-10 days for suture removal or sooner if signs of infection

## 2015-10-26 NOTE — ED Provider Notes (Signed)
CSN: [id number]     Arrival date & time 10/26/15  1000 History   First MD Initiated Contact with Patient 10/26/15 1128     Chief Complaint  Patient presents with  . Laceration   (Consider location/radiation/quality/duration/timing/severity/associated sxs/prior Treatment) Patient is a 49 y.o. male presenting with skin laceration. The history is provided by the patient.  Laceration Location:  Leg Leg laceration location:  L lower leg (posterior left lower leg; over the achilles tendon area) Depth:  Through dermis Quality: straight   Bleeding: controlled   Time since incident:  12 hours Laceration mechanism:  Broken glass (states he dropped a glass bowl that shattered and a broken piece cut his leg)   Past Medical History  Diagnosis Date  . TIA (transient ischemic attack)    Past Surgical History  Procedure Laterality Date  . Colonoscopy  2008   Family History  Problem Relation Age of Onset  . Anemia Maternal Aunt   . Parkinson's disease Father    Social History  Substance Use Topics  . Smoking status: Former Smoker    Quit date: 12/22/2013  . Smokeless tobacco: Former Systems developer    Quit date: 11/21/2013  . Alcohol Use: No    Review of Systems  Allergies  Review of patient's allergies indicates no known allergies.  Home Medications   Prior to Admission medications   Medication Sig Start Date End Date Taking? Authorizing Provider  aspirin 81 MG tablet Take 81 mg by mouth daily.    Historical Provider, MD  escitalopram (LEXAPRO) 10 MG tablet Take 1 tablet (10 mg total) by mouth daily. 09/10/15   Glean Hess, MD  Multiple Vitamins-Minerals (MULTIVITAMIN GUMMIES ADULT) CHEW Chew 1 tablet by mouth daily.    Historical Provider, MD   Meds Ordered and Administered this Visit   Medications  Tdap (BOOSTRIX) injection 0.5 mL (0.5 mLs Intramuscular Given 10/26/15 1042)    BP 107/62 mmHg  Pulse 66  Temp(Src) 98 F (36.7 C) (Tympanic)  Resp 20  Ht 6\' 1"  (1.854 m)  Wt  198 lb (89.812 kg)  BMI 26.13 kg/m2  SpO2 99% No data found.   Physical Exam  Constitutional: He appears well-developed and well-nourished. No distress.  Musculoskeletal:  Normal range of motion of ankle/foot  Skin: He is not diaphoretic.  Left lower extremity neurovascularly intact; 2cm left lower posterior leg skin laceration; does not involved tendon; normal range of motion of foot/leg  Nursing note and vitals reviewed.   ED Course  Procedures (including critical care time)  Labs Review Labs Reviewed - No data to display  Imaging Review No results found.   Visual Acuity Review  Right Eye Distance:   Left Eye Distance:   Bilateral Distance:    Right Eye Near:   Left Eye Near:    Bilateral Near:         MDM   1. Laceration of leg, left, initial encounter    1.  diagnosis reviewed with patient 2.  Area cleaned and prepped in sterile fashion; wound anesthetized with 1% lidocaine; wound examined and an approximately 2-47mm foreign body (piece of broken glass) visualized at the edge of the wound and removed; no other foreign bodies visualized; 3 interrupted sutures placed with 5.0 Nylon; patient tolerated procedure well; wound dressed 3. Wound/laceration care instructions given 4. Follow-up in 7-10 days or sooner prn if any problems     Norval Gable, MD 10/26/15 1212

## 2015-10-26 NOTE — ED Notes (Signed)
Pt cut left lower leg on a broken bowl last pm

## 2015-11-08 DIAGNOSIS — I83813 Varicose veins of bilateral lower extremities with pain: Secondary | ICD-10-CM | POA: Insufficient documentation

## 2016-03-22 HISTORY — PX: VASCULAR SURGERY: SHX849

## 2016-09-16 ENCOUNTER — Other Ambulatory Visit: Payer: Self-pay | Admitting: Internal Medicine

## 2016-09-16 DIAGNOSIS — F329 Major depressive disorder, single episode, unspecified: Secondary | ICD-10-CM

## 2016-09-16 DIAGNOSIS — F32A Depression, unspecified: Secondary | ICD-10-CM

## 2016-09-18 NOTE — Telephone Encounter (Signed)
Patient is scheduled on 2/9 @ 830 for CPE added to cancellation list

## 2016-11-27 ENCOUNTER — Encounter: Payer: Self-pay | Admitting: Internal Medicine

## 2016-11-27 ENCOUNTER — Ambulatory Visit (INDEPENDENT_AMBULATORY_CARE_PROVIDER_SITE_OTHER): Payer: BLUE CROSS/BLUE SHIELD | Admitting: Internal Medicine

## 2016-11-27 VITALS — BP 116/76 | HR 67 | Resp 16 | Ht 73.0 in | Wt 199.0 lb

## 2016-11-27 DIAGNOSIS — Z125 Encounter for screening for malignant neoplasm of prostate: Secondary | ICD-10-CM | POA: Diagnosis not present

## 2016-11-27 DIAGNOSIS — Z0000 Encounter for general adult medical examination without abnormal findings: Secondary | ICD-10-CM

## 2016-11-27 DIAGNOSIS — G458 Other transient cerebral ischemic attacks and related syndromes: Secondary | ICD-10-CM | POA: Diagnosis not present

## 2016-11-27 DIAGNOSIS — I83813 Varicose veins of bilateral lower extremities with pain: Secondary | ICD-10-CM | POA: Diagnosis not present

## 2016-11-27 DIAGNOSIS — F320 Major depressive disorder, single episode, mild: Secondary | ICD-10-CM

## 2016-11-27 LAB — POCT URINALYSIS DIPSTICK
BILIRUBIN UA: NEGATIVE
Glucose, UA: NEGATIVE
KETONES UA: NEGATIVE
Leukocytes, UA: NEGATIVE
NITRITE UA: NEGATIVE
PH UA: 6
PROTEIN UA: NEGATIVE
RBC UA: NEGATIVE
Spec Grav, UA: 1.01
Urobilinogen, UA: 0.2

## 2016-11-27 NOTE — Patient Instructions (Addendum)
Begin regular exercise 30-45 minutes 4-5 times per week recommended  Health Maintenance, Male A healthy lifestyle and preventative care can promote health and wellness.  Maintain regular health, dental, and eye exams.  Eat a healthy diet. Foods like vegetables, fruits, whole grains, low-fat dairy products, and lean protein foods contain the nutrients you need and are low in calories. Decrease your intake of foods high in solid fats, added sugars, and salt. Get information about a proper diet from your health care provider, if necessary.  Regular physical exercise is one of the most important things you can do for your health. Most adults should get at least 150 minutes of moderate-intensity exercise (any activity that increases your heart rate and causes you to sweat) each week. In addition, most adults need muscle-strengthening exercises on 2 or more days a week.   Maintain a healthy weight. The body mass index (BMI) is a screening tool to identify possible weight problems. It provides an estimate of body fat based on height and weight. Your health care provider can find your BMI and can help you achieve or maintain a healthy weight. For males 20 years and older:  A BMI below 18.5 is considered underweight.  A BMI of 18.5 to 24.9 is normal.  A BMI of 25 to 29.9 is considered overweight.  A BMI of 30 and above is considered obese.  Maintain normal blood lipids and cholesterol by exercising and minimizing your intake of saturated fat. Eat a balanced diet with plenty of fruits and vegetables. Blood tests for lipids and cholesterol should begin at age 18 and be repeated every 5 years. If your lipid or cholesterol levels are high, you are over age 64, or you are at high risk for heart disease, you may need your cholesterol levels checked more frequently.Ongoing high lipid and cholesterol levels should be treated with medicines if diet and exercise are not working.  If you smoke, find out from  your health care provider how to quit. If you do not use tobacco, do not start.  Lung cancer screening is recommended for adults aged 85-80 years who are at high risk for developing lung cancer because of a history of smoking. A yearly low-dose CT scan of the lungs is recommended for people who have at least a 30-pack-year history of smoking and are current smokers or have quit within the past 15 years. A pack year of smoking is smoking an average of 1 pack of cigarettes a day for 1 year (for example, a 30-pack-year history of smoking could mean smoking 1 pack a day for 30 years or 2 packs a day for 15 years). Yearly screening should continue until the smoker has stopped smoking for at least 15 years. Yearly screening should be stopped for people who develop a health problem that would prevent them from having lung cancer treatment.  If you choose to drink alcohol, do not have more than 2 drinks per day. One drink is considered to be 12 oz (360 mL) of beer, 5 oz (150 mL) of wine, or 1.5 oz (45 mL) of liquor.  Avoid the use of street drugs. Do not share needles with anyone. Ask for help if you need support or instructions about stopping the use of drugs.  High blood pressure causes heart disease and increases the risk of stroke. High blood pressure is more likely to develop in:  People who have blood pressure in the end of the normal range (100-139/85-89 mm Hg).  People who  are overweight or obese.  People who are African American.  If you are 24-61 years of age, have your blood pressure checked every 3-5 years. If you are 80 years of age or older, have your blood pressure checked every year. You should have your blood pressure measured twice-once when you are at a hospital or clinic, and once when you are not at a hospital or clinic. Record the average of the two measurements. To check your blood pressure when you are not at a hospital or clinic, you can use:  An automated blood pressure machine at  a pharmacy.  A home blood pressure monitor.  If you are 69-28 years old, ask your health care provider if you should take aspirin to prevent heart disease.  Diabetes screening involves taking a blood sample to check your fasting blood sugar level. This should be done once every 3 years after age 48 if you are at a normal weight and without risk factors for diabetes. Testing should be considered at a younger age or be carried out more frequently if you are overweight and have at least 1 risk factor for diabetes.  Colorectal cancer can be detected and often prevented. Most routine colorectal cancer screening begins at the age of 26 and continues through age 21. However, your health care provider may recommend screening at an earlier age if you have risk factors for colon cancer. On a yearly basis, your health care provider may provide home test kits to check for hidden blood in the stool. A small camera at the end of a tube may be used to directly examine the colon (sigmoidoscopy or colonoscopy) to detect the earliest forms of colorectal cancer. Talk to your health care provider about this at age 25 when routine screening begins. A direct exam of the colon should be repeated every 5-10 years through age 4, unless early forms of precancerous polyps or small growths are found.  People who are at an increased risk for hepatitis B should be screened for this virus. You are considered at high risk for hepatitis B if:  You were born in a country where hepatitis B occurs often. Talk with your health care provider about which countries are considered high risk.  Your parents were born in a high-risk country and you have not received a shot to protect against hepatitis B (hepatitis B vaccine).  You have HIV or AIDS.  You use needles to inject street drugs.  You live with, or have sex with, someone who has hepatitis B.  You are a man who has sex with other men (MSM).  You get hemodialysis  treatment.  You take certain medicines for conditions like cancer, organ transplantation, and autoimmune conditions.  Hepatitis C blood testing is recommended for all people born from [birth year] through 1964 and any individual with known risk factors for hepatitis C.  Healthy men should no longer receive prostate-specific antigen (PSA) blood tests as part of routine cancer screening. Talk to your health care provider about prostate cancer screening.  Testicular cancer screening is not recommended for adolescents or adult males who have no symptoms. Screening includes self-exam, a health care provider exam, and other screening tests. Consult with your health care provider about any symptoms you have or any concerns you have about testicular cancer.  Practice safe sex. Use condoms and avoid high-risk sexual practices to reduce the spread of sexually transmitted infections (STIs).  You should be screened for STIs, including gonorrhea and chlamydia if:  You  are sexually active and are younger than 24 years.  You are older than 24 years, and your health care provider tells you that you are at risk for this type of infection.  Your sexual activity has changed since you were last screened, and you are at an increased risk for chlamydia or gonorrhea. Ask your health care provider if you are at risk.  If you are at risk of being infected with HIV, it is recommended that you take a prescription medicine daily to prevent HIV infection. This is called pre-exposure prophylaxis (PrEP). You are considered at risk if:  You are a man who has sex with other men (MSM).  You are a heterosexual man who is sexually active with multiple partners.  You take drugs by injection.  You are sexually active with a partner who has HIV.  Talk with your health care provider about whether you are at high risk of being infected with HIV. If you choose to begin PrEP, you should first be tested for HIV. You should then be tested  every 3 months for as long as you are taking PrEP.  Use sunscreen. Apply sunscreen liberally and repeatedly throughout the day. You should seek shade when your shadow is shorter than you. Protect yourself by wearing long sleeves, pants, a wide-brimmed hat, and sunglasses year round whenever you are outdoors.  Tell your health care provider of new moles or changes in moles, especially if there is a change in shape or color. Also, tell your health care provider if a mole is larger than the size of a pencil eraser.  A one-time screening for abdominal aortic aneurysm (AAA) and surgical repair of large AAAs by ultrasound is recommended for men aged 51-75 years who are current or former smokers.  Stay current with your vaccines (immunizations). This information is not intended to replace advice given to you by your health care provider. Make sure you discuss any questions you have with your health care provider. Document Released: 06/05/2008 Document Revised: 12/29/2014 Document Reviewed: 09/11/2015 Elsevier Interactive Patient Education  2017 [employer].

## 2016-11-27 NOTE — Progress Notes (Signed)
Date:  11/27/2016   Name:  Bob Mills   DOB:  01-02-1966   MRN:  [id number]   Chief Complaint: Annual Exam Bob Mills is a 50 y.o. male who presents today for his Complete Annual Exam. He feels well. He reports not exercising regularly. He reports he is sleeping well.  Since he was here last he had vein surgery on the left and it is improved with no more bleeding. His older sister recently had colonoscopy with multiple polyps and they recommended that her siblings have colonoscopies. He has had no further TIA sx since 2006.  Etiology was attributed to PDA - no repair recommended.  Review of Systems  Constitutional: Negative for appetite change, chills, diaphoresis, fatigue and unexpected weight change.  HENT: Negative for hearing loss, tinnitus, trouble swallowing and voice change.   Eyes: Negative for visual disturbance.  Respiratory: Negative for choking, shortness of breath and wheezing.   Cardiovascular: Negative for chest pain, palpitations and leg swelling.  Gastrointestinal: Negative for abdominal pain, blood in stool, constipation and diarrhea.  Genitourinary: Negative for difficulty urinating, dysuria and frequency.  Musculoskeletal: Negative for arthralgias, back pain and myalgias.  Skin: Negative for color change and rash.  Neurological: Negative for dizziness, syncope and headaches.  Hematological: Negative for adenopathy.  Psychiatric/Behavioral: Negative for dysphoric mood and sleep disturbance.    Patient Active Problem List   Diagnosis Date Noted  . Mild single current episode of major depressive disorder ([place]) 11/27/2016  . Varicose veins of both lower extremities with pain 11/08/2015  . Compulsive tobacco user syndrome 06/06/2015  . Transient ischemic attack (TIA) 06/06/2015  . Patent arterial duct 06/06/2015  . Superficial thrombophlebitis of leg 06/06/2015  . Depression 06/06/2015    Prior to Admission medications   Medication Sig Start  Date End Date Taking? Authorizing Provider  aspirin 81 MG tablet Take 81 mg by mouth daily.   Yes Historical Provider, MD  escitalopram (LEXAPRO) 10 MG tablet TAKE ONE TABLET BY MOUTH ONCE DAILY 09/17/16  Yes Glean Hess, MD  Multiple Vitamins-Minerals (MULTIVITAMIN GUMMIES ADULT) CHEW Chew 1 tablet by mouth daily.   Yes Historical Provider, MD    No Known Allergies  Past Surgical History:  Procedure Laterality Date  . COLONOSCOPY  2008  . VASCULAR SURGERY  03/22/2016    Social History  Substance Use Topics  . Smoking status: Former Smoker    Quit date: 12/22/2013  . Smokeless tobacco: Former Systems developer    Quit date: 11/21/2013  . Alcohol use No     Medication list has been reviewed and updated.   Physical Exam  Constitutional: He is oriented to person, place, and time. He appears well-developed and well-nourished.  HENT:  Head: Normocephalic.  Right Ear: Tympanic membrane, external ear and ear canal normal.  Left Ear: Tympanic membrane, external ear and ear canal normal.  Nose: Nose normal.  Mouth/Throat: Uvula is midline and oropharynx is clear and moist.  Eyes: Conjunctivae and EOM are normal. Pupils are equal, round, and reactive to light.  Neck: Normal range of motion. Neck supple. Carotid bruit is not present. No thyromegaly present.  Cardiovascular: Normal rate, regular rhythm, normal heart sounds and intact distal pulses.   varaicose veins both legs - improved on left with hyperpigmentation noted - no tenderness present  Pulmonary/Chest: Effort normal and breath sounds normal. He has no wheezes. Right breast exhibits no mass. Left breast exhibits no mass.  Abdominal: Soft. Normal appearance and bowel sounds are  normal. There is no hepatosplenomegaly. There is no tenderness.  Musculoskeletal: Normal range of motion.  Lymphadenopathy:    He has no cervical adenopathy.  Neurological: He is alert and oriented to person, place, and time. He has normal reflexes.  Skin: Skin  is warm, dry and intact.  Psychiatric: He has a normal mood and affect. His speech is normal and behavior is normal. Judgment and thought content normal.  Nursing note and vitals reviewed.   BP 116/76   Pulse 67   Resp 16   Ht 6\' 1"  (1.854 m)   Wt 199 lb (90.3 kg)   SpO2 99%   BMI 26.25 kg/m   Assessment and Plan: 1. Annual physical exam Recommend regular exercise Colonoscopy next year unless sister reports colon cancer - POCT urinalysis dipstick - CBC with Differential/Platelet - Comprehensive metabolic panel - Lipid panel  2. Prostate cancer screening DRE deferred - PSA  3. Mild single current episode of major depressive disorder (H[facility]) Stable on medications  4. Varicose veins of both lower extremities with pain improved  5. Other specified transient cerebral ischemias Continue ASA daily   Bob Maidens, MD [facility]  11/27/2016

## 2016-11-28 LAB — COMPREHENSIVE METABOLIC PANEL
ALBUMIN: 4.5 g/dL (ref 3.5–5.5)
ALK PHOS: 78 IU/L (ref 39–117)
ALT: 19 IU/L (ref 0–44)
AST: 20 IU/L (ref 0–40)
Albumin/Globulin Ratio: 1.7 (ref 1.2–2.2)
BILIRUBIN TOTAL: 0.4 mg/dL (ref 0.0–1.2)
BUN / CREAT RATIO: 17 (ref 9–20)
BUN: 11 mg/dL (ref 6–24)
CHLORIDE: 97 mmol/L (ref 96–106)
CO2: 25 mmol/L (ref 18–29)
Calcium: 9.5 mg/dL (ref 8.7–10.2)
Creatinine, Ser: 0.65 mg/dL — ABNORMAL LOW (ref 0.76–1.27)
GFR calc Af Amer: 132 mL/min/{1.73_m2} (ref >59)
GFR calc non Af Amer: 114 mL/min/{1.73_m2} (ref >59)
GLOBULIN, TOTAL: 2.7 g/dL (ref 1.5–4.5)
GLUCOSE: 78 mg/dL (ref 65–99)
Potassium: 4.6 mmol/L (ref 3.5–5.2)
SODIUM: 137 mmol/L (ref 134–144)
Total Protein: 7.2 g/dL (ref 6.0–8.5)

## 2016-11-28 LAB — CBC WITH DIFFERENTIAL/PLATELET
BASOS: 0 %
Basophils Absolute: 0 10*3/uL (ref 0.0–0.2)
EOS (ABSOLUTE): 0.1 10*3/uL (ref 0.0–0.4)
EOS: 2 %
HEMATOCRIT: 37.8 % (ref 37.5–51.0)
HEMOGLOBIN: 13.1 g/dL (ref 13.0–17.7)
Immature Grans (Abs): 0 10*3/uL (ref 0.0–0.1)
Immature Granulocytes: 0 %
LYMPHS ABS: 2.8 10*3/uL (ref 0.7–3.1)
Lymphs: 40 %
MCH: 30.4 pg (ref 26.6–33.0)
MCHC: 34.7 g/dL (ref 31.5–35.7)
MCV: 88 fL (ref 79–97)
MONOS ABS: 0.7 10*3/uL (ref 0.1–0.9)
Monocytes: 10 %
NEUTROS ABS: 3.3 10*3/uL (ref 1.4–7.0)
Neutrophils: 48 %
Platelets: 249 10*3/uL (ref 150–379)
RBC: 4.31 x10E6/uL (ref 4.14–5.80)
RDW: 13 % (ref 12.3–15.4)
WBC: 6.9 10*3/uL (ref 3.4–10.8)

## 2016-11-28 LAB — LIPID PANEL
CHOLESTEROL TOTAL: 158 mg/dL (ref 100–199)
Chol/HDL Ratio: 2.7 ratio units (ref 0.0–5.0)
HDL: 58 mg/dL (ref >39)
LDL Calculated: 89 mg/dL (ref 0–99)
TRIGLYCERIDES: 56 mg/dL (ref 0–149)
VLDL Cholesterol Cal: 11 mg/dL (ref 5–40)

## 2016-11-28 LAB — PSA: Prostate Specific Ag, Serum: 0.4 ng/mL (ref 0.0–4.0)

## 2017-01-30 ENCOUNTER — Encounter: Payer: BLUE CROSS/BLUE SHIELD | Admitting: Internal Medicine

## 2017-05-03 ENCOUNTER — Other Ambulatory Visit: Payer: Self-pay | Admitting: Internal Medicine

## 2017-05-03 DIAGNOSIS — F32A Depression, unspecified: Secondary | ICD-10-CM

## 2017-05-03 DIAGNOSIS — F329 Major depressive disorder, single episode, unspecified: Secondary | ICD-10-CM

## 2017-12-03 ENCOUNTER — Encounter: Payer: BLUE CROSS/BLUE SHIELD | Admitting: Internal Medicine

## 2017-12-11 ENCOUNTER — Other Ambulatory Visit: Payer: Self-pay | Admitting: Internal Medicine

## 2017-12-11 DIAGNOSIS — F32A Depression, unspecified: Secondary | ICD-10-CM

## 2017-12-11 DIAGNOSIS — F329 Major depressive disorder, single episode, unspecified: Secondary | ICD-10-CM

## 2017-12-25 ENCOUNTER — Encounter: Payer: Self-pay | Admitting: Internal Medicine

## 2017-12-25 ENCOUNTER — Ambulatory Visit (INDEPENDENT_AMBULATORY_CARE_PROVIDER_SITE_OTHER): Payer: BLUE CROSS/BLUE SHIELD | Admitting: Internal Medicine

## 2017-12-25 VITALS — BP 112/62 | HR 58 | Ht 73.0 in | Wt 204.0 lb

## 2017-12-25 DIAGNOSIS — Z0000 Encounter for general adult medical examination without abnormal findings: Secondary | ICD-10-CM

## 2017-12-25 DIAGNOSIS — D485 Neoplasm of uncertain behavior of skin: Secondary | ICD-10-CM | POA: Diagnosis not present

## 2017-12-25 DIAGNOSIS — Z1211 Encounter for screening for malignant neoplasm of colon: Secondary | ICD-10-CM | POA: Diagnosis not present

## 2017-12-25 DIAGNOSIS — Z0001 Encounter for general adult medical examination with abnormal findings: Secondary | ICD-10-CM | POA: Diagnosis not present

## 2017-12-25 DIAGNOSIS — M65322 Trigger finger, left index finger: Secondary | ICD-10-CM

## 2017-12-25 DIAGNOSIS — F320 Major depressive disorder, single episode, mild: Secondary | ICD-10-CM | POA: Diagnosis not present

## 2017-12-25 LAB — POCT URINALYSIS DIPSTICK
Bilirubin, UA: NEGATIVE
Blood, UA: NEGATIVE
Glucose, UA: NEGATIVE
Ketones, UA: NEGATIVE
Leukocytes, UA: NEGATIVE
Nitrite, UA: NEGATIVE
Protein, UA: NEGATIVE
Spec Grav, UA: 1.02
Urobilinogen, UA: 0.2 U/dL
pH, UA: 6

## 2017-12-25 MED ORDER — ESCITALOPRAM OXALATE 10 MG PO TABS: 10.0000 mg | ORAL_TABLET | Freq: Every day | ORAL | 12 refills | Status: DC

## 2017-12-25 NOTE — Progress Notes (Signed)
Date:  12/25/2017   Name:  Bob Mills   DOB:  24-Sep-1966   MRN:  [id number]   Chief Complaint: Annual Exam Bob Mills is a 52 y.o. male who presents today for his Complete Annual Exam. He feels well. He reports exercising none. He reports he is sleeping well. He is due for a colonoscopy.  He denies BPH sx, nocturia or hesitancy.  Depression         This is a chronic problem.  The problem occurs rarely.  The problem has been resolved since onset.  Associated symptoms include no fatigue, no appetite change, no myalgias and no headaches.  Past treatments include SSRIs - Selective serotonin reuptake inhibitors.  Finger problem - he has catching of his left index finger, worse in the AM when it feels stiff.  No injury.  No significant pain.  He has some swelling at the base on the palm side.   Review of Systems  Constitutional: Negative for appetite change, chills, diaphoresis, fatigue and unexpected weight change.  HENT: Negative for hearing loss, tinnitus, trouble swallowing and voice change.   Eyes: Negative for visual disturbance.  Respiratory: Negative for choking, shortness of breath and wheezing.   Cardiovascular: Negative for chest pain, palpitations and leg swelling.  Gastrointestinal: Negative for abdominal pain, blood in stool, constipation and diarrhea.  Genitourinary: Negative for difficulty urinating, dysuria and frequency.  Musculoskeletal: Negative for arthralgias, back pain and myalgias.       Triggering of left index finger  Skin: Negative for color change and rash.  Allergic/Immunologic: Negative for environmental allergies.  Neurological: Negative for dizziness, syncope and headaches.  Hematological: Negative for adenopathy.  Psychiatric/Behavioral: Positive for depression. Negative for dysphoric mood and sleep disturbance.    Patient Active Problem List   Diagnosis Date Noted  . Mild single current episode of major depressive disorder ([place])  11/27/2016  . Varicose veins of both lower extremities with pain 11/08/2015  . Tobacco use disorder, moderate, in sustained remission 06/06/2015  . Transient ischemic attack (TIA) 06/06/2015  . Patent arterial duct 06/06/2015  . Superficial thrombophlebitis of leg 06/06/2015    Prior to Admission medications   Medication Sig Start Date End Date Taking? Authorizing Provider  escitalopram (LEXAPRO) 10 MG tablet TAKE 1 TABLET BY MOUTH ONCE DAILY 12/12/17   Glean Hess, MD  Multiple Vitamins-Minerals (MULTIVITAMIN GUMMIES ADULT) CHEW Chew 1 tablet by mouth daily.    [provider]    No Known Allergies  Past Surgical History:  Procedure Laterality Date  . COLONOSCOPY  2008  . VASCULAR SURGERY  03/22/2016    Social History   Tobacco Use  . Smoking status: Former Smoker    Types: Cigarettes    Last attempt to quit: 12/22/2013    Years since quitting: 4.0  . Smokeless tobacco: Former Systems developer    Quit date: 11/21/2013  Substance Use Topics  . Alcohol use: No    Alcohol/week: 0.0 oz  . Drug use: No     Medication list has been reviewed and updated.  PHQ 2/9 Scores 12/25/2017  PHQ - 2 Score 0  PHQ- 9 Score 0    Physical Exam  Constitutional: He is oriented to person, place, and time. He appears well-developed and well-nourished. No distress.  HENT:  Head: Normocephalic and atraumatic.  Right Ear: Tympanic membrane, external ear and ear canal normal.  Left Ear: Tympanic membrane, external ear and ear canal normal.  Nose: Nose normal.  Mouth/Throat: Uvula  is midline and oropharynx is clear and moist.  Eyes: Conjunctivae and EOM are normal. Pupils are equal, round, and reactive to light.  Neck: Normal range of motion. Neck supple. Carotid bruit is not present. No thyromegaly present.  Cardiovascular: Normal rate, regular rhythm, normal heart sounds and intact distal pulses.  Pulmonary/Chest: Effort normal and breath sounds normal. No respiratory distress. He has no  wheezes. Right breast exhibits no mass. Left breast exhibits no mass.  Abdominal: Soft. Normal appearance and bowel sounds are normal. There is no hepatosplenomegaly. There is no tenderness.  Musculoskeletal: Normal range of motion.  Trigger finger left index  Lymphadenopathy:    He has no cervical adenopathy.  Neurological: He is alert and oriented to person, place, and time. He has normal reflexes.  Skin: Skin is warm, dry and intact. No rash noted.  Psychiatric: He has a normal mood and affect. His speech is normal and behavior is normal. Judgment and thought content normal.  Nursing note and vitals reviewed.   BP 112/62   Pulse (!) 58   Ht 6\' 1"  (1.854 m)   Wt 204 lb (92.5 kg)   SpO2 97%   BMI 26.91 kg/m   Assessment and Plan: 1. Annual physical exam Normal exam - CBC with Differential/Platelet - Comprehensive metabolic panel - Lipid panel - POCT urinalysis dipstick  2. Colon cancer screening - Ambulatory referral to Gastroenterology  3. Mild single current episode of major depressive disorder ([place]) Doing well on current therapy - escitalopram (LEXAPRO) 10 MG tablet; Take 1 tablet (10 mg total) by mouth daily.  Dispense: 30 tablet; Refill: 12  4. Neoplasm of uncertain behavior of skin - Ambulatory referral to Dermatology Needs skin survery  5. Trigger index finger of left hand Take Aleve bid - if no relief or worsening, will refer to Ortho   Meds ordered this encounter  Medications  . escitalopram (LEXAPRO) 10 MG tablet    Sig: Take 1 tablet (10 mg total) by mouth daily.    Dispense:  30 tablet    Refill:  12    Please consider 90 day supplies to promote better adherence    Partially dictated using Editor, commissioning. Any errors are unintentional.  Halina Maidens, MD [facility]  12/25/2017

## 2017-12-25 NOTE — Patient Instructions (Signed)

## 2017-12-26 LAB — CBC WITH DIFFERENTIAL/PLATELET
BASOS ABS: 0 10*3/uL (ref 0.0–0.2)
BASOS: 1 %
EOS (ABSOLUTE): 0.2 10*3/uL (ref 0.0–0.4)
Eos: 3 %
HEMOGLOBIN: 13.3 g/dL (ref 13.0–17.7)
Hematocrit: 39.6 % (ref 37.5–51.0)
IMMATURE GRANS (ABS): 0 10*3/uL (ref 0.0–0.1)
IMMATURE GRANULOCYTES: 0 %
LYMPHS: 41 %
Lymphocytes Absolute: 3 10*3/uL (ref 0.7–3.1)
MCH: 29.9 pg (ref 26.6–33.0)
MCHC: 33.6 g/dL (ref 31.5–35.7)
MCV: 89 fL (ref 79–97)
MONOCYTES: 8 %
Monocytes Absolute: 0.6 10*3/uL (ref 0.1–0.9)
NEUTROS ABS: 3.5 10*3/uL (ref 1.4–7.0)
NEUTROS PCT: 47 %
PLATELETS: 232 10*3/uL (ref 150–379)
RBC: 4.45 x10E6/uL (ref 4.14–5.80)
RDW: 12.8 % (ref 12.3–15.4)
WBC: 7.3 10*3/uL (ref 3.4–10.8)

## 2017-12-26 LAB — COMPREHENSIVE METABOLIC PANEL
ALBUMIN: 4.5 g/dL (ref 3.5–5.5)
ALT: 33 IU/L (ref 0–44)
AST: 23 IU/L (ref 0–40)
Albumin/Globulin Ratio: 1.5 (ref 1.2–2.2)
Alkaline Phosphatase: 99 IU/L (ref 39–117)
BUN/Creatinine Ratio: 16 (ref 9–20)
BUN: 11 mg/dL (ref 6–24)
Bilirubin Total: 0.2 mg/dL (ref 0.0–1.2)
CALCIUM: 9.4 mg/dL (ref 8.7–10.2)
CHLORIDE: 102 mmol/L (ref 96–106)
CO2: 22 mmol/L (ref 20–29)
Creatinine, Ser: 0.67 mg/dL — ABNORMAL LOW (ref 0.76–1.27)
GFR calc Af Amer: 130 mL/min/{1.73_m2} (ref >59)
GFR calc non Af Amer: 112 mL/min/{1.73_m2} (ref >59)
GLUCOSE: 100 mg/dL — AB (ref 65–99)
Globulin, Total: 3.1 g/dL (ref 1.5–4.5)
POTASSIUM: 4.3 mmol/L (ref 3.5–5.2)
Sodium: 138 mmol/L (ref 134–144)
TOTAL PROTEIN: 7.6 g/dL (ref 6.0–8.5)

## 2017-12-26 LAB — LIPID PANEL
CHOLESTEROL TOTAL: 168 mg/dL (ref 100–199)
Chol/HDL Ratio: 2.7 ratio (ref 0.0–5.0)
HDL: 62 mg/dL (ref >39)
LDL Calculated: 90 mg/dL (ref 0–99)
TRIGLYCERIDES: 78 mg/dL (ref 0–149)
VLDL Cholesterol Cal: 16 mg/dL (ref 5–40)

## 2018-01-22 ENCOUNTER — Encounter: Payer: Self-pay | Admitting: *Deleted

## 2018-06-01 ENCOUNTER — Ambulatory Visit
Admission: EM | Admit: 2018-06-01 | Discharge: 2018-06-01 | Disposition: A | Discharge status: Home / Self Care | Payer: BLUE CROSS/BLUE SHIELD | Attending: Family Medicine | Admitting: Family Medicine

## 2018-06-01 ENCOUNTER — Other Ambulatory Visit: Payer: Self-pay

## 2018-06-01 ENCOUNTER — Encounter: Payer: Self-pay | Admitting: Emergency Medicine

## 2018-06-01 DIAGNOSIS — S39012A Strain of muscle, fascia and tendon of lower back, initial encounter: Secondary | ICD-10-CM

## 2018-06-01 HISTORY — DX: Depression, unspecified: F32.A

## 2018-06-01 HISTORY — DX: Major depressive disorder, single episode, unspecified: F32.9

## 2018-06-01 MED ORDER — NAPROXEN 500 MG PO TABS: 500.0000 mg | ORAL_TABLET | Freq: Two times a day (BID) | ORAL | 0 refills | Status: DC

## 2018-06-01 MED ORDER — KETOROLAC TROMETHAMINE 60 MG/2ML IM SOLN
60.0000 mg | Freq: Once | INTRAMUSCULAR | Status: AC
  Administered 2018-06-01: 60 mg via INTRAMUSCULAR

## 2018-06-01 MED ORDER — METAXALONE 800 MG PO TABS: 800.0000 mg | ORAL_TABLET | Freq: Three times a day (TID) | ORAL | 0 refills | Status: DC

## 2018-06-01 NOTE — ED Provider Notes (Addendum)
[facility]    CSN: [id number] Arrival date & time: 06/01/18  1229     History   Chief Complaint Chief Complaint  Patient presents with  . Back Pain    HPI Bob Mills is a 52 y.o. male.   HPI  52 year old male presents with back pain that he has had for about a week and a half.  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  On Monday Tuesday Wednesday's he has most of his stock that needs to be shelved.  He does not remember any specific incident but states that on Sunday he had to put up stock while on his hands and knees because his back hurts so badly.  Pain has worsened since Sunday, 05/30/2018.  Patient states he has had numerous incidents of back pain in the past but the last worst one was about 15 years ago.  His back pain clears quickly.  He has been using ibuprofen heat and ice but has not been able to become pain-free.  He has no incontinence.  States that the back pain is mostly in the lower lumbar segments with no radiation into his buttocks.        Past Medical History:  Diagnosis Date  . Depression   . TIA (transient ischemic attack)     Patient Active Problem List   Diagnosis Date Noted  . Trigger index finger of left hand 12/25/2017  . Mild single current episode of major depressive disorder ([place]) 11/27/2016  . Varicose veins of both lower extremities with pain 11/08/2015  . Tobacco use disorder, moderate, in sustained remission 06/06/2015  . Transient ischemic attack (TIA) 06/06/2015  . Patent arterial duct 06/06/2015  . Superficial thrombophlebitis of leg 06/06/2015    Past Surgical History:  Procedure Laterality Date  . COLONOSCOPY  2008  . VASCULAR SURGERY  03/22/2016       Home Medications    Prior to Admission medications   Medication Sig Start Date End Date Taking? Authorizing Provider  escitalopram (LEXAPRO) 10 MG tablet Take 1 tablet (10 mg total) by mouth daily. 12/25/17  Yes  Glean Hess, MD  Multiple Vitamins-Minerals (MULTIVITAMIN GUMMIES ADULT) CHEW Chew 1 tablet by mouth daily.   Yes [provider]  metaxalone (SKELAXIN) 800 MG tablet Take 1 tablet (800 mg total) by mouth 3 (three) times daily. 06/01/18   Lorin Picket, PA-C  naproxen (NAPROSYN) 500 MG tablet Take 1 tablet (500 mg total) by mouth 2 (two) times daily with a meal. 06/01/18   Lorin Picket, PA-C    Family History Family History  Problem Relation Age of Onset  . Anemia Maternal Aunt   . Parkinson's disease Father   . Colon polyps Mother   . Colon polyps Sister     Social History Social History   Tobacco Use  . Smoking status: Former Smoker    Types: Cigarettes    Last attempt to quit: 12/22/2013    Years since quitting: 4.4  . Smokeless tobacco: Former Systems developer    Quit date: 11/21/2013  Substance Use Topics  . Alcohol use: No    Alcohol/week: 0.0 oz  . Drug use: No     Allergies   Patient has no known allergies.   Review of Systems Review of Systems  Constitutional: Positive for activity change. Negative for appetite change, chills, fatigue and fever.  Musculoskeletal: Positive for back pain and myalgias.  All other systems  reviewed and are negative.    Physical Exam Triage Vital Signs ED Triage Vitals [06/01/18 1252]  Enc Vitals Group     BP 105/77     Pulse Rate 83     Resp 16     Temp 98.6 F (37 C)     Temp Source Oral     SpO2 97 %     Weight 200 lb (90.7 kg)     Height 6\' 1"  (1.854 m)     Head Circumference      Peak Flow      Pain Score 3     Pain Loc      Pain Edu?      Excl. in [place]?    No data found.  Updated Vital Signs BP 105/77 (BP Location: Left Arm)   Pulse 83   Temp 98.6 F (37 C) (Oral)   Resp 16   Ht 6\' 1"  (1.854 m)   Wt 200 lb (90.7 kg)   SpO2 97%   BMI 26.39 kg/m   Visual Acuity Right Eye Distance:   Left Eye Distance:   Bilateral Distance:    Right Eye Near:   Left Eye Near:    Bilateral Near:      Physical Exam  Constitutional: He is oriented to person, place, and time. He appears well-developed and well-nourished. No distress.  HENT:  Head: Normocephalic.  Eyes: Pupils are equal, round, and reactive to light. Right eye exhibits no discharge. Left eye exhibits no discharge.  Neck: Normal range of motion.  Musculoskeletal: He exhibits tenderness.  Examination of the lumbar spine shows a level pelvis in stance.  Is able to forward flex with his hands to the level of his knees.  Maintaining a upright posture is more difficult.  He has  no lower lumbar segment motion with lateral flexion bilaterally.  There is apparent spasm in this area.  He is tender in the lower lumbar segments L4-5 L5-S1 levels.  Able to toe and heel walk normally.  Sensation is intact to the lower extremities.  Straight leg raise testing is negative in the seated position at 90 degrees.  DTRs are 2+/4 at the knees and ankles and symmetrical.  Neurological: He is alert and oriented to person, place, and time.  Skin: Skin is warm and dry. He is not diaphoretic.  Psychiatric: He has a normal mood and affect. His behavior is normal. Judgment and thought content normal.  Nursing note and vitals reviewed.    UC Treatments / Results  Labs (all labs ordered are listed, but only abnormal results are displayed) Labs Reviewed - No data to display  EKG None  Radiology No results found.  Procedures Procedures (including critical care time)  Medications Ordered in UC Medications  ketorolac (TORADOL) injection 60 mg (60 mg Intramuscular Given 06/01/18 1335)    Initial Impression / Assessment and Plan / UC Course  I have reviewed the triage vital signs and the nursing notes.  Pertinent labs & imaging results that were available during my care of the patient were reviewed by me and considered in my medical decision making (see chart for details).     Plan: 1. Test/x-ray results and diagnosis reviewed with  patient 2. rx as per orders; risks, benefits, potential side effects reviewed with patient 3. Recommend supportive treatment with rest and symptom avoidance.  Off the ibuprofen at this point and switch him to Naprosyn twice daily with food.  We will give him muscle relaxers for  comfort.  He was given cautions regarding the use.  Continue to use ice 20 minutes every 2 hours 4-5 times daily.  I recommended that he follow-up with his primary care physician Dr. Army Melia next week if he is not improving.  We decided to treat this conservatively today no lumbar spine films were obtained.  We will leave this to the discretion of his primary care 4. F/u prn if symptoms worsen or don't improve  Final Clinical Impressions(s) / UC Diagnoses   Final diagnoses:  Strain of lumbar region, initial encounter     Discharge Instructions     Apply ice 20 minutes out of every 2 hours 4-5 times daily for comfort. Use  Caution while taking muscle relaxers.  Do not perform activities requiring concentration or judgment and do not drive.    ED Prescriptions    Medication Sig Dispense Auth. Provider   naproxen (NAPROSYN) 500 MG tablet Take 1 tablet (500 mg total) by mouth 2 (two) times daily with a meal. 60 tablet Lorin Picket, PA-C   metaxalone (SKELAXIN) 800 MG tablet Take 1 tablet (800 mg total) by mouth 3 (three) times daily. 21 tablet Lorin Picket, PA-C     Controlled Substance Prescriptions [place] Controlled Substance Registry consulted? Not Applicable   Lorin Picket, PA-C 06/01/18 1443    Lorin Picket, PA-C 06/01/18 1529

## 2018-06-01 NOTE — ED Triage Notes (Signed)
Patient in today c/o back pain x 1.5 weeks worsening since Sunday (05/30/18). Patient works at [employer] and does a bit of lifting and gets in awkward positions. Patient states he has "thrown his back out before" but the last time was ~ 15 years ago.

## 2018-06-01 NOTE — Discharge Instructions (Signed)
Apply ice 20 minutes out of every 2 hours 4-5 times daily for comfort. Use  Caution while taking muscle relaxers.  Do not perform activities requiring concentration or judgment and do not drive.  °

## 2018-08-30 ENCOUNTER — Other Ambulatory Visit: Payer: Self-pay

## 2018-08-30 DIAGNOSIS — Z1211 Encounter for screening for malignant neoplasm of colon: Secondary | ICD-10-CM

## 2018-09-02 ENCOUNTER — Other Ambulatory Visit: Payer: Self-pay

## 2018-09-02 ENCOUNTER — Encounter: Payer: Self-pay | Admitting: *Deleted

## 2018-09-08 NOTE — Discharge Instructions (Signed)
General Anesthesia, Adult, Care After °These instructions provide you with information about caring for yourself after your procedure. Your health care provider may also give you more specific instructions. Your treatment has been planned according to current medical practices, but problems sometimes occur. Call your health care provider if you have any problems or questions after your procedure. °What can I expect after the procedure? °After the procedure, it is common to have: °· Vomiting. °· A sore throat. °· Mental slowness. ° °It is common to feel: °· Nauseous. °· Cold or shivery. °· Sleepy. °· Tired. °· Sore or achy, even in parts of your body where you did not have surgery. ° °Follow these instructions at home: °For at least 24 hours after the procedure: °· Do not: °? Participate in activities where you could fall or become injured. °? Drive. °? Use heavy machinery. °? Drink alcohol. °? Take sleeping pills or medicines that cause drowsiness. °? Make important decisions or sign legal documents. °? Take care of children on your own. °· Rest. °Eating and drinking °· If you vomit, drink water, juice, or soup when you can drink without vomiting. °· Drink enough fluid to keep your urine clear or pale yellow. °· Make sure you have little or no nausea before eating solid foods. °· Follow the diet recommended by your health care provider. °General instructions °· Have a responsible adult stay with you until you are awake and alert. °· Return to your normal activities as told by your health care provider. Ask your health care provider what activities are safe for you. °· Take over-the-counter and prescription medicines only as told by your health care provider. °· If you smoke, do not smoke without supervision. °· Keep all follow-up visits as told by your health care provider. This is important. °Contact a health care provider if: °· You continue to have nausea or vomiting at home, and medicines are not helpful. °· You  cannot drink fluids or start eating again. °· You cannot urinate after 8-12 hours. °· You develop a skin rash. °· You have fever. °· You have increasing redness at the site of your procedure. °Get help right away if: °· You have difficulty breathing. °· You have chest pain. °· You have unexpected bleeding. °· You feel that you are having a life-threatening or urgent problem. °This information is not intended to replace advice given to you by your health care provider. Make sure you discuss any questions you have with your health care provider. °Document Released: 03/16/2001 Document Revised: 05/12/2016 Document Reviewed: 11/22/2015 °Elsevier Interactive Patient Education © 2018 [employer] ° °

## 2018-09-09 ENCOUNTER — Ambulatory Visit: Payer: BLUE CROSS/BLUE SHIELD | Admitting: Anesthesiology

## 2018-09-09 ENCOUNTER — Ambulatory Visit
Admission: RE | Admit: 2018-09-09 | Discharge: 2018-09-09 | Disposition: A | Discharge status: Home / Self Care | Payer: BLUE CROSS/BLUE SHIELD | Source: Ambulatory Visit | Attending: Gastroenterology | Admitting: Gastroenterology

## 2018-09-09 ENCOUNTER — Encounter
Admission: RE | Disposition: A | Discharge status: Home / Self Care | Payer: Self-pay | Source: Ambulatory Visit | Attending: Gastroenterology

## 2018-09-09 DIAGNOSIS — K635 Polyp of colon: Secondary | ICD-10-CM | POA: Diagnosis not present

## 2018-09-09 DIAGNOSIS — Z1211 Encounter for screening for malignant neoplasm of colon: Secondary | ICD-10-CM

## 2018-09-09 DIAGNOSIS — D125 Benign neoplasm of sigmoid colon: Secondary | ICD-10-CM

## 2018-09-09 DIAGNOSIS — D123 Benign neoplasm of transverse colon: Secondary | ICD-10-CM

## 2018-09-09 DIAGNOSIS — K640 First degree hemorrhoids: Secondary | ICD-10-CM | POA: Diagnosis not present

## 2018-09-09 DIAGNOSIS — I739 Peripheral vascular disease, unspecified: Secondary | ICD-10-CM | POA: Insufficient documentation

## 2018-09-09 DIAGNOSIS — D122 Benign neoplasm of ascending colon: Secondary | ICD-10-CM | POA: Diagnosis not present

## 2018-09-09 DIAGNOSIS — Z8673 Personal history of transient ischemic attack (TIA), and cerebral infarction without residual deficits: Secondary | ICD-10-CM | POA: Insufficient documentation

## 2018-09-09 DIAGNOSIS — F329 Major depressive disorder, single episode, unspecified: Secondary | ICD-10-CM | POA: Diagnosis not present

## 2018-09-09 DIAGNOSIS — Z87891 Personal history of nicotine dependence: Secondary | ICD-10-CM | POA: Diagnosis not present

## 2018-09-09 HISTORY — DX: Other complications of anesthesia, initial encounter: T88.59XA

## 2018-09-09 HISTORY — DX: Adverse effect of unspecified anesthetic, initial encounter: T41.45XA

## 2018-09-09 HISTORY — PX: POLYPECTOMY: SHX5525

## 2018-09-09 HISTORY — DX: Varicose veins of bilateral lower extremities with pain: I83.813

## 2018-09-09 HISTORY — DX: Presence of dental prosthetic device (complete) (partial): Z97.2

## 2018-09-09 HISTORY — DX: Motion sickness, initial encounter: T75.3XXA

## 2018-09-09 HISTORY — DX: Patent ductus arteriosus: Q25.0

## 2018-09-09 HISTORY — PX: COLONOSCOPY WITH PROPOFOL: SHX5780

## 2018-09-09 SURGERY — COLONOSCOPY WITH PROPOFOL
Anesthesia: General | Site: Rectum

## 2018-09-09 MED ORDER — LACTATED RINGERS IV SOLN
1000.0000 mL | INTRAVENOUS | Status: DC
  Administered 2018-09-09: 10:00:00 via INTRAVENOUS

## 2018-09-09 MED ORDER — PROPOFOL 10 MG/ML IV BOLUS
INTRAVENOUS | Status: DC | PRN
  Administered 2018-09-09: 30 mg via INTRAVENOUS
  Administered 2018-09-09: 40 mg via INTRAVENOUS
  Administered 2018-09-09: 30 mg via INTRAVENOUS
  Administered 2018-09-09: 90 mg via INTRAVENOUS
  Administered 2018-09-09: 30 mg via INTRAVENOUS
  Administered 2018-09-09: 40 mg via INTRAVENOUS

## 2018-09-09 MED ORDER — LIDOCAINE HCL (CARDIAC) PF 100 MG/5ML IV SOSY
PREFILLED_SYRINGE | INTRAVENOUS | Status: DC | PRN
  Administered 2018-09-09: 30 mg via INTRAVENOUS

## 2018-09-09 SURGICAL SUPPLY — 12 items
CANISTER SUCT 1200ML W/VALVE (MISCELLANEOUS) ×2 IMPLANT
CLIP HMST 235XBRD CATH ROT (MISCELLANEOUS) IMPLANT
CLIP RESOLUTION 360 11X235 (MISCELLANEOUS)
ELECT REM PT RETURN 9FT ADLT (ELECTROSURGICAL)
ELECTRODE REM PT RTRN 9FT ADLT (ELECTROSURGICAL) IMPLANT
FORCEPS BIOP RAD 4 LRG CAP 4 (CUTTING FORCEPS) ×2 IMPLANT
GOWN CVR UNV OPN BCK APRN NK (MISCELLANEOUS) ×2 IMPLANT
GOWN ISOL THUMB LOOP REG UNIV (MISCELLANEOUS) ×2
KIT ENDO PROCEDURE OLY (KITS) ×2 IMPLANT
SNARE SHORT THROW 13M SML OVAL (MISCELLANEOUS) ×2 IMPLANT
TRAP ETRAP POLY (MISCELLANEOUS) ×2 IMPLANT
WATER STERILE IRR 250ML POUR (IV SOLUTION) ×2 IMPLANT

## 2018-09-09 NOTE — Anesthesia Preprocedure Evaluation (Addendum)
Anesthesia Evaluation  Patient identified by MRN, date of birth, ID band Patient awake    Reviewed: Allergy & Precautions, NPO status , Patient's Chart, lab work & pertinent test results, reviewed documented beta blocker date and time   Airway Mallampati: II  TM Distance: >3 FB Neck ROM: Full    Dental no notable dental hx.    Pulmonary former smoker,    Pulmonary exam normal breath sounds clear to auscultation       Cardiovascular + Peripheral Vascular Disease  Normal cardiovascular exam Rhythm:Regular Rate:Normal     Neuro/Psych PSYCHIATRIC DISORDERS Depression TIA   GI/Hepatic negative GI ROS, Neg liver ROS,   Endo/Other  negative endocrine ROS  Renal/GU negative Renal ROS  negative genitourinary   Musculoskeletal negative musculoskeletal ROS (+)   Abdominal Normal abdominal exam  (+)   Peds  Hematology negative hematology ROS (+)   Anesthesia Other Findings   Reproductive/Obstetrics                            Anesthesia Physical Anesthesia Plan  ASA: II  Anesthesia Plan: General   Post-op Pain Management:    Induction: Intravenous  PONV Risk Score and Plan:   Airway Management Planned: Natural Airway  Additional Equipment: None  Intra-op Plan:   Post-operative Plan:   Informed Consent: I have reviewed the patients History and Physical, chart, labs and discussed the procedure including the risks, benefits and alternatives for the proposed anesthesia with the patient or authorized representative who has indicated his/her understanding and acceptance.     Plan Discussed with: CRNA, Anesthesiologist and Surgeon  Anesthesia Plan Comments:         Anesthesia Quick Evaluation

## 2018-09-09 NOTE — Transfer of Care (Signed)
Immediate Anesthesia Transfer of Care Note  Patient: WOODRUFF SKIRVIN  Procedure(s) Performed: COLONOSCOPY WITH PROPOFOL WITH BIOPSY (N/A Rectum) POLYPECTOMY (N/A Rectum)  Patient Location: PACU  Anesthesia Type: General  Level of Consciousness: awake, alert  and patient cooperative  Airway and Oxygen Therapy: Patient Spontanous Breathing and Patient connected to supplemental oxygen  Post-op Assessment: Post-op Vital signs reviewed, Patient's Cardiovascular Status Stable, Respiratory Function Stable, Patent Airway and No signs of Nausea or vomiting  Post-op Vital Signs: Reviewed and stable  Complications: No apparent anesthesia complications

## 2018-09-09 NOTE — Anesthesia Postprocedure Evaluation (Signed)
Anesthesia Post Note  Patient: Bob Mills  Procedure(s) Performed: COLONOSCOPY WITH PROPOFOL WITH BIOPSY (N/A Rectum) POLYPECTOMY (N/A Rectum)  Patient location during evaluation: PACU Anesthesia Type: General Level of consciousness: awake Pain management: pain level controlled Vital Signs Assessment: post-procedure vital signs reviewed and stable Respiratory status: spontaneous breathing Cardiovascular status: blood pressure returned to baseline Postop Assessment: no headache Anesthetic complications: no    Lavonna Monarch

## 2018-09-09 NOTE — H&P (Signed)
Bob Lame, MD [facility] [address] [place], [place] [address] Phone: [phone] Fax : [phone]  Primary Care Physician:  Glean Hess, MD Primary Gastroenterologist:  Dr. Allen Norris  Pre-Procedure History & Physical: HPI:  Bob Mills is a 52 y.o. male is here for a screening colonoscopy.   Past Medical History:  Diagnosis Date  . Complication of anesthesia    "anesthesia doesn't work"  . Depression   . Motion sickness    ocean ships  . Patent arterial duct 06/06/2015  . TIA (transient ischemic attack)   . Varicose veins of bilateral lower extremities with pain   . Wears dentures    partial upper (1 tooth)    Past Surgical History:  Procedure Laterality Date  . COLONOSCOPY  2008  . VASCULAR SURGERY  03/22/2016  . WISDOM TOOTH EXTRACTION      Prior to Admission medications   Medication Sig Start Date End Date Taking? Authorizing Provider  clindamycin (CLEOCIN T) 1 % lotion Apply topically 3 (three) times daily as needed.   Yes [provider]  escitalopram (LEXAPRO) 10 MG tablet Take 1 tablet (10 mg total) by mouth daily. 12/25/17  Yes Glean Hess, MD  Ketoconazole 2 % GEL Apply topically 3 (three) times daily as needed.   Yes [provider]  Multiple Vitamins-Minerals (MULTIVITAMIN GUMMIES ADULT) CHEW Chew 1 tablet by mouth daily.   Yes [provider]    Allergies as of 08/30/2018  . (No Known Allergies)    Family History  Problem Relation Age of Onset  . Anemia Maternal Aunt   . Parkinson's disease Father   . Colon polyps Mother   . Colon polyps Sister     Social History   Socioeconomic History  . Marital status: Single    Spouse name: Not on file  . Number of children: 0  . Years of education: Not on file  . Highest education level: Not on file  Occupational History  . Not on file  Social Needs  . Financial resource strain: Not hard at all  . Food insecurity:    Worry: Never true   Inability: Never true  . Transportation needs:    Medical: No    Non-medical: No  Tobacco Use  . Smoking status: Former Smoker    Types: Cigarettes    Last attempt to quit: 12/22/2013    Years since quitting: 4.7  . Smokeless tobacco: Former Systems developer    Quit date: 11/21/2013  Substance and Sexual Activity  . Alcohol use: No    Alcohol/week: 0.0 standard drinks  . Drug use: No  . Sexual activity: Not on file  Lifestyle  . Physical activity:    Days per week: 0 days    Minutes per session: 0 min  . Stress: Not at all  Relationships  . Social connections:    Talks on phone: More than three times a week    Gets together: Three times a week    Attends religious service: Never    Active member of club or organization: No    Attends meetings of clubs or organizations: Never    Relationship status: Living with partner  . Intimate partner violence:    Fear of current or ex partner: No    Emotionally abused: No    Physically abused: No    Forced sexual activity: No  Other Topics Concern  . Not on file  Social History Narrative  . Not on file  Review of Systems: See HPI, otherwise negative ROS  Physical Exam: BP 116/80   Pulse (!) 57   Temp 98.2 F (36.8 C) (Temporal)   Ht 6\' 1"  (1.854 m)   Wt 88.9 kg   SpO2 98%   BMI 25.86 kg/m  General:   Alert,  pleasant and cooperative in NAD Head:  Normocephalic and atraumatic. Neck:  Supple; no masses or thyromegaly. Lungs:  Clear throughout to auscultation.    Heart:  Regular rate and rhythm. Abdomen:  Soft, nontender and nondistended. Normal bowel sounds, without guarding, and without rebound.   Neurologic:  Alert and  oriented x4;  grossly normal neurologically.  Impression/Plan: Bob Mills is now here to undergo a screening colonoscopy.  Risks, benefits, and alternatives regarding colonoscopy have been reviewed with the patient.  Questions have been answered.  All parties agreeable.

## 2018-09-09 NOTE — Op Note (Signed)
[facility] Patient Name: Bob Mills Procedure Date: 09/09/2018 9:44 AM MRN: [id number] Account #: [id number] Date of Birth: 04-23-1966 Admit Type: Outpatient Age: 52 Room: [facility] OR ROOM 01 Gender: Male Note Status: Finalized Procedure:            Colonoscopy Indications:          Screening for colorectal malignant neoplasm Providers:            [name] MD, MD Referring MD:         Halina Maidens, MD (Referring MD) Medicines:            Propofol per Anesthesia Complications:        No immediate complications. Procedure:            Pre-Anesthesia Assessment:                       - Prior to the procedure, a History and Physical was                        performed, and patient medications and allergies were                        reviewed. The patient's tolerance of previous                        anesthesia was also reviewed. The risks and benefits of                        the procedure and the sedation options and risks were                        discussed with the patient. All questions were                        answered, and informed consent was obtained. Prior                        Anticoagulants: The patient has taken no previous                        anticoagulant or antiplatelet agents. ASA Grade                        Assessment: II - A patient with mild systemic disease.                        After reviewing the risks and benefits, the patient was                        deemed in satisfactory condition to undergo the                        procedure.                       After obtaining informed consent, the colonoscope was                        passed under direct vision. Throughout the procedure,  the patient's blood pressure, pulse, and oxygen                        saturations were monitored continuously. The was                        introduced through the anus and advanced to the the                   cecum, identified by appendiceal orifice and ileocecal                        valve. The colonoscopy was performed without                        difficulty. The patient tolerated the procedure well.                        The quality of the bowel preparation was excellent. Findings:      The perianal and digital rectal examinations were normal.      A 6 mm polyp was found in the sigmoid colon. The polyp was sessile. The       polyp was removed with a cold snare. Resection and retrieval were       complete.      A 4 mm polyp was found in the ascending colon. The polyp was sessile.       The polyp was removed with a cold biopsy forceps. Resection and       retrieval were complete.      A 3 mm polyp was found in the transverse colon. The polyp was sessile.       The polyp was removed with a cold biopsy forceps. Resection and       retrieval were complete.      Non-bleeding internal hemorrhoids were found during retroflexion. The       hemorrhoids were Grade I (internal hemorrhoids that do not prolapse). Impression:           - One 6 mm polyp in the sigmoid colon, removed with a                        cold snare. Resected and retrieved.                       - One 4 mm polyp in the ascending colon, removed with a                        cold biopsy forceps. Resected and retrieved.                       - One 3 mm polyp in the transverse colon, removed with                        a cold biopsy forceps. Resected and retrieved.                       - Non-bleeding internal hemorrhoids. Recommendation:       - Discharge patient to home.                       - Resume previous  diet.                       - Continue present medications.                       - Await pathology results.                       - Repeat colonoscopy in 5 years if polyp adenoma and 10                        years if hyperplastic Procedure Code(s):    --- Professional ---                       [phone],  Colonoscopy, flexible; with removal of tumor(s),                        polyp(s), or other lesion(s) by snare technique                       45380, 75, Colonoscopy, flexible; with biopsy, single                        or multiple Diagnosis Code(s):    --- Professional ---                       Z12.11, Encounter for screening for malignant neoplasm                        of colon                       D12.5, Benign neoplasm of sigmoid colon                       D12.2, Benign neoplasm of ascending colon                       D12.3, Benign neoplasm of transverse colon (hepatic                        flexure or splenic flexure) CPT copyright 2017 American Medical Association. All rights reserved. The codes documented in this report are preliminary and upon coder review may  be revised to meet current compliance requirements. [name] MD, MD 09/09/2018 10:18:22 AM This report has been signed electronically. Number of Addenda: 0 Note Initiated On: 09/09/2018 9:44 AM Scope Withdrawal Time: 0 hours 6 minutes 10 seconds  Total Procedure Duration: 0 hours 15 minutes 21 seconds       [facility]

## 2018-09-10 ENCOUNTER — Encounter: Payer: Self-pay | Admitting: Gastroenterology

## 2018-09-13 ENCOUNTER — Encounter: Payer: Self-pay | Admitting: Gastroenterology

## 2018-09-14 ENCOUNTER — Encounter: Payer: Self-pay | Admitting: Gastroenterology

## 2018-12-30 ENCOUNTER — Ambulatory Visit (INDEPENDENT_AMBULATORY_CARE_PROVIDER_SITE_OTHER): Payer: BLUE CROSS/BLUE SHIELD | Admitting: Internal Medicine

## 2018-12-30 ENCOUNTER — Encounter: Payer: Self-pay | Admitting: Internal Medicine

## 2018-12-30 VITALS — BP 128/70 | HR 60 | Ht 73.0 in | Wt 215.2 lb

## 2018-12-30 DIAGNOSIS — Z0000 Encounter for general adult medical examination without abnormal findings: Secondary | ICD-10-CM | POA: Diagnosis not present

## 2018-12-30 DIAGNOSIS — Z125 Encounter for screening for malignant neoplasm of prostate: Secondary | ICD-10-CM

## 2018-12-30 DIAGNOSIS — F320 Major depressive disorder, single episode, mild: Secondary | ICD-10-CM

## 2018-12-30 LAB — POCT URINALYSIS DIPSTICK
Bilirubin, UA: NEGATIVE
Blood, UA: NEGATIVE
Glucose, UA: NEGATIVE
Ketones, UA: NEGATIVE
LEUKOCYTES UA: NEGATIVE
NITRITE UA: NEGATIVE
PH UA: 6 (ref 5.0–8.0)
PROTEIN UA: NEGATIVE
SPEC GRAV UA: 1.01 (ref 1.010–1.025)
Urobilinogen, UA: 0.2 E.U./dL

## 2018-12-30 MED ORDER — ESCITALOPRAM OXALATE 10 MG PO TABS: 10.0000 mg | ORAL_TABLET | Freq: Every day | ORAL | 3 refills | Status: DC

## 2018-12-30 NOTE — Progress Notes (Signed)
Date:  12/30/2018   Name:  Bob Mills   DOB:  [birth year]   MRN:  [id number]   Chief Complaint: Annual Exam Bob Mills is a 53 y.o. male who presents today for his Complete Annual Exam. He feels fairly well. He reports exercising none. He reports he is sleeping well.  He is up to date on influenza vaccine.  Colonoscopy was done last year. He is interested in the Shingrix vaccine. He has gained 20 lbs over the past year - he changed from diet soda to regular soda when someone told him diet soda can cause alzheimers.  He drinks 1-2 liters per day.  Depression         This is a chronic problem.  The problem has been resolved since onset.  Associated symptoms include no fatigue, no helplessness, no appetite change, no myalgias, no headaches and no suicidal ideas.  Past treatments include SSRIs - Selective serotonin reuptake inhibitors.   Review of Systems  Constitutional: Positive for unexpected weight change. Negative for appetite change, chills, diaphoresis and fatigue.  HENT: Negative for hearing loss, tinnitus, trouble swallowing and voice change.   Eyes: Negative for visual disturbance.  Respiratory: Negative for choking, shortness of breath and wheezing.   Cardiovascular: Negative for chest pain, palpitations and leg swelling.  Gastrointestinal: Negative for abdominal pain, blood in stool, constipation and diarrhea.  Genitourinary: Negative for difficulty urinating, dysuria, frequency, hematuria, scrotal swelling and testicular pain.  Musculoskeletal: Negative for arthralgias, back pain and myalgias.  Skin: Negative for color change and rash.  Neurological: Negative for dizziness, syncope and headaches.  Hematological: Negative for adenopathy.  Psychiatric/Behavioral: Positive for depression. Negative for dysphoric mood, sleep disturbance and suicidal ideas.    Patient Active Problem List   Diagnosis Date Noted  . Special screening for malignant neoplasms, colon   .  Benign neoplasm of ascending colon   . Benign neoplasm of transverse colon   . Polyp of sigmoid colon   . Trigger index finger of left hand 12/25/2017  . Mild single current episode of major depressive disorder ([place]) 11/27/2016  . Varicose veins of both lower extremities with pain 11/08/2015  . Tobacco use disorder, moderate, in sustained remission 06/06/2015  . Transient ischemic attack (TIA) 06/06/2015  . Patent arterial duct 06/06/2015  . Superficial thrombophlebitis of leg 06/06/2015    No Known Allergies  Past Surgical History:  Procedure Laterality Date  . COLONOSCOPY  2008  . COLONOSCOPY WITH PROPOFOL N/A 09/09/2018   Procedure: COLONOSCOPY WITH PROPOFOL WITH BIOPSY;  Surgeon: [name], MD;  Location: [place];  Service: Endoscopy;  Laterality: N/A;  . POLYPECTOMY N/A 09/09/2018   Procedure: POLYPECTOMY;  Surgeon: [name], MD;  Location: [place];  Service: Endoscopy;  Laterality: N/A;  . VASCULAR SURGERY  03/22/2016  . WISDOM TOOTH EXTRACTION      Social History   Tobacco Use  . Smoking status: Former Smoker    Types: Cigarettes    Last attempt to quit: 12/22/2013    Years since quitting: 5.0  . Smokeless tobacco: Former Systems developer    Quit date: 11/21/2013  Substance Use Topics  . Alcohol use: No    Alcohol/week: 0.0 standard drinks  . Drug use: No     Medication list has been reviewed and updated.  Current Meds  Medication Sig  . clindamycin (CLEOCIN T) 1 % lotion Apply topically 3 (three) times daily as needed.  Marland Kitchen escitalopram (LEXAPRO) 10 MG tablet Take  1 tablet (10 mg total) by mouth daily.  Marland Kitchen Ketoconazole 2 % GEL Apply topically 3 (three) times daily as needed.  . Multiple Vitamins-Minerals (MULTIVITAMIN GUMMIES ADULT) CHEW Chew 1 tablet by mouth daily.    PHQ 2/9 Scores 12/30/2018 12/25/2017  PHQ - 2 Score 0 0  PHQ- 9 Score - 0    Physical Exam Vitals signs and nursing note reviewed.  Constitutional:      Appearance: Normal  appearance. He is well-developed.  HENT:     Head: Normocephalic.     Right Ear: Tympanic membrane, ear canal and external ear normal.     Left Ear: Tympanic membrane, ear canal and external ear normal.     Nose: Nose normal.     Mouth/Throat:     Mouth: Mucous membranes are moist.     Pharynx: Uvula midline.  Eyes:     Conjunctiva/sclera: Conjunctivae normal.     Pupils: Pupils are equal, round, and reactive to light.  Neck:     Musculoskeletal: Normal range of motion and neck supple.     Thyroid: No thyromegaly.     Vascular: No carotid bruit.  Cardiovascular:     Rate and Rhythm: Normal rate and regular rhythm.     Pulses: Normal pulses.     Heart sounds: Normal heart sounds.  Pulmonary:     Effort: Pulmonary effort is normal.     Breath sounds: Normal breath sounds. No wheezing.  Chest:     Breasts:        Right: No mass.        Left: No mass.  Abdominal:     General: Bowel sounds are normal.     Palpations: Abdomen is soft.     Tenderness: There is no abdominal tenderness.  Musculoskeletal: Normal range of motion.  Lymphadenopathy:     Cervical: No cervical adenopathy.  Skin:    General: Skin is warm and dry.  Neurological:     Mental Status: He is alert and oriented to person, place, and time.     Deep Tendon Reflexes: Reflexes are normal and symmetric.  Psychiatric:        Speech: Speech normal.        Behavior: Behavior normal.        Thought Content: Thought content normal.        Judgment: Judgment normal.    Wt Readings from Last 3 Encounters:  12/30/18 215 lb 3.2 oz (97.6 kg)  09/09/18 196 lb (88.9 kg)  06/01/18 200 lb (90.7 kg)    BP 128/70 (BP Location: Right Arm, Patient Position: Sitting, Cuff Size: Normal)   Pulse 60   Ht 6\' 1"  (1.854 m)   Wt 215 lb 3.2 oz (97.6 kg)   SpO2 96%   BMI 28.39 kg/m   Assessment and Plan: 1. Annual physical exam Stop sweet drinks and drink more water Regular exercise recommended Call in 1-2 months to schedule  Shingrix - CBC with Differential/Platelet - Comprehensive metabolic panel - Lipid panel - POCT urinalysis dipstick  2. Mild single current episode of major depressive disorder ([place]) Doing well on lexapro - escitalopram (LEXAPRO) 10 MG tablet; Take 1 tablet (10 mg total) by mouth daily.  Dispense: 90 tablet; Refill: 3  3. Prostate cancer screening DRE deferred - PSA   Partially dictated using Dragon software. Any errors are unintentional.  Halina Maidens, MD [facility]  12/30/2018

## 2018-12-30 NOTE — Patient Instructions (Signed)

## 2018-12-31 LAB — CBC WITH DIFFERENTIAL/PLATELET
Basophils Absolute: 0.1 10*3/uL (ref 0.0–0.2)
Basos: 1 %
EOS (ABSOLUTE): 0.1 10*3/uL (ref 0.0–0.4)
Eos: 1 %
HEMOGLOBIN: 13.1 g/dL (ref 13.0–17.7)
Hematocrit: 37.5 % (ref 37.5–51.0)
IMMATURE GRANS (ABS): 0 10*3/uL (ref 0.0–0.1)
IMMATURE GRANULOCYTES: 1 %
LYMPHS: 31 %
Lymphocytes Absolute: 2.2 10*3/uL (ref 0.7–3.1)
MCH: 30.3 pg (ref 26.6–33.0)
MCHC: 34.9 g/dL (ref 31.5–35.7)
MCV: 87 fL (ref 79–97)
MONOCYTES: 9 %
Monocytes Absolute: 0.6 10*3/uL (ref 0.1–0.9)
NEUTROS PCT: 57 %
Neutrophils Absolute: 4 10*3/uL (ref 1.4–7.0)
Platelets: 299 10*3/uL (ref 150–450)
RBC: 4.32 x10E6/uL (ref 4.14–5.80)
RDW: 11.8 % (ref 11.6–15.4)
WBC: 7 10*3/uL (ref 3.4–10.8)

## 2018-12-31 LAB — LIPID PANEL
Chol/HDL Ratio: 3.7 ratio (ref 0.0–5.0)
Cholesterol, Total: 199 mg/dL (ref 100–199)
HDL: 54 mg/dL (ref >39)
LDL CALC: 132 mg/dL — AB (ref 0–99)
Triglycerides: 67 mg/dL (ref 0–149)
VLDL CHOLESTEROL CAL: 13 mg/dL (ref 5–40)

## 2018-12-31 LAB — COMPREHENSIVE METABOLIC PANEL
ALBUMIN: 4.6 g/dL (ref 3.5–5.5)
ALT: 81 IU/L — ABNORMAL HIGH (ref 0–44)
AST: 45 IU/L — ABNORMAL HIGH (ref 0–40)
Albumin/Globulin Ratio: 1.8 (ref 1.2–2.2)
Alkaline Phosphatase: 102 IU/L (ref 39–117)
BILIRUBIN TOTAL: 0.2 mg/dL (ref 0.0–1.2)
BUN / CREAT RATIO: 14 (ref 9–20)
BUN: 9 mg/dL (ref 6–24)
CALCIUM: 9.4 mg/dL (ref 8.7–10.2)
CHLORIDE: 100 mmol/L (ref 96–106)
CO2: 24 mmol/L (ref 20–29)
CREATININE: 0.66 mg/dL — AB (ref 0.76–1.27)
GFR, EST AFRICAN AMERICAN: 129 mL/min/{1.73_m2} (ref >59)
GFR, EST NON AFRICAN AMERICAN: 112 mL/min/{1.73_m2} (ref >59)
GLUCOSE: 88 mg/dL (ref 65–99)
Globulin, Total: 2.5 g/dL (ref 1.5–4.5)
Potassium: 4.5 mmol/L (ref 3.5–5.2)
Sodium: 138 mmol/L (ref 134–144)
TOTAL PROTEIN: 7.1 g/dL (ref 6.0–8.5)

## 2018-12-31 LAB — PSA: Prostate Specific Ag, Serum: 0.4 ng/mL (ref 0.0–4.0)

## 2019-01-03 NOTE — Progress Notes (Signed)
Patient informed of labs. Coming for recheck lab OV in 3 months ( scheduled while on the phone ). Pt stated he does not drink alcohol or use tylenol.

## 2019-03-29 ENCOUNTER — Ambulatory Visit: Payer: Self-pay | Admitting: Internal Medicine

## 2019-03-31 ENCOUNTER — Other Ambulatory Visit: Payer: Self-pay

## 2019-03-31 ENCOUNTER — Ambulatory Visit: Payer: Self-pay | Admitting: Internal Medicine

## 2019-03-31 DIAGNOSIS — R945 Abnormal results of liver function studies: Principal | ICD-10-CM

## 2019-03-31 DIAGNOSIS — R7989 Other specified abnormal findings of blood chemistry: Secondary | ICD-10-CM

## 2019-04-01 LAB — HEPATIC FUNCTION PANEL
ALT: 15 IU/L (ref 0–44)
AST: 20 IU/L (ref 0–40)
Albumin: 4.3 g/dL (ref 3.8–4.9)
Alkaline Phosphatase: 68 IU/L (ref 39–117)
Bilirubin Total: 0.3 mg/dL (ref 0.0–1.2)
Bilirubin, Direct: 0.1 mg/dL (ref 0.00–0.40)
Total Protein: 7 g/dL (ref 6.0–8.5)

## 2019-04-29 ENCOUNTER — Other Ambulatory Visit: Payer: Self-pay

## 2019-04-29 ENCOUNTER — Ambulatory Visit
Admission: EM | Admit: 2019-04-29 | Discharge: 2019-04-29 | Disposition: A | Discharge status: Home / Self Care | Payer: BLUE CROSS/BLUE SHIELD | Attending: Family Medicine | Admitting: Family Medicine

## 2019-04-29 ENCOUNTER — Encounter: Payer: Self-pay | Admitting: Emergency Medicine

## 2019-04-29 DIAGNOSIS — S39012A Strain of muscle, fascia and tendon of lower back, initial encounter: Secondary | ICD-10-CM

## 2019-04-29 DIAGNOSIS — M62830 Muscle spasm of back: Secondary | ICD-10-CM

## 2019-04-29 MED ORDER — METHOCARBAMOL 500 MG PO TABS: 500.0000 mg | ORAL_TABLET | Freq: Two times a day (BID) | ORAL | 0 refills | Status: DC

## 2019-04-29 MED ORDER — KETOROLAC TROMETHAMINE 10 MG PO TABS: 10.0000 mg | ORAL_TABLET | Freq: Four times a day (QID) | ORAL | 0 refills | Status: DC | PRN

## 2019-04-29 NOTE — ED Triage Notes (Signed)
Patient c/o back pain that started a few weeks ago. 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

## 2019-04-29 NOTE — Discharge Instructions (Signed)
It was very nice meeting you today in clinic. Thank you for entrusting me with your care.   As discussed, your pain seems to be musculoskeletal in nature. Plans for treating you are as follows:  Please utilize the medications that we discussed. Your prescriptions have been called in to your pharmacy.  Avoid overdoing it, but you need to make efforts to remain active as tolerated.  Avoiding activity all together can make your pain worse. You may find that alternating between ice and moist heat application will help with your pain.  Heat/ice should be applied for 10-15 minutes at a time at least 3-4 times a day.  Make arrangements to follow up with your regular doctor in 1 week for re-evaluation. If your symptoms/condition worsens, please seek follow up care either here or in the ER. Please remember, our [place] providers are "right here with you" when you need [place].   Again, it was my pleasure to take care of you today. Thank you for choosing our clinic. I hope that you start to feel better quickly.   Honor Loh, MSN, APRN, FNP-C, CEN Advanced Practice Provider [facility]

## 2019-04-29 NOTE — ED Provider Notes (Signed)
[address], [place], [place] [address] [phone]   Name: Bob Mills DOB: 03/12/1966 MRN: [id number] CSN: [id number] PCP: Glean Hess, MD  Arrival date and time:  04/29/19 1451  Chief Complaint:  Back Pain  NOTE: Prior to seeing the patient today, I have reviewed the triage nursing documentation and vital signs. Clinical staff has updated patient's PMH/PSHx, current medication list, and drug allergies/intolerances to ensure comprehensive history available to assist in medical decision making.   History:   HPI: Bob Mills is a 53 y.o. male who presents today with complaints of lower back pain that has been worsening over the course of the last 2 weeks. Patient reports increased physical activity on Monday of this week. He states, "I was doing some really hard work that caused my back to hurt so much worse". Pain does not radiate into his lower extremities. He denies previous back surgeries. Patient has not experienced any bowel/bladder incontinence.   Provoking/exacerbating factors include movement, standing, and sitting. Patient notes that he is only able to really get comfortable when he is supine. Patient has been taking IBU and using heat with minimal relief.   Past Medical History:  Diagnosis Date  . Complication of anesthesia    "anesthesia doesn't work"  . Depression   . Motion sickness    ocean ships  . Patent arterial duct 06/06/2015  . TIA (transient ischemic attack)   . Varicose veins of bilateral lower extremities with pain   . Wears dentures    partial upper (1 tooth)    Past Surgical History:  Procedure Laterality Date  . COLONOSCOPY  2008  . COLONOSCOPY WITH PROPOFOL N/A 09/09/2018   Procedure: COLONOSCOPY WITH PROPOFOL WITH BIOPSY;  Surgeon: [name], MD;  Location: [facility];  Service: Endoscopy;  Laterality: N/A;  . POLYPECTOMY N/A 09/09/2018   Procedure: POLYPECTOMY;  Surgeon: [name], MD;  Location:  [facility];  Service: Endoscopy;  Laterality: N/A;  . VASCULAR SURGERY  03/22/2016  . WISDOM TOOTH EXTRACTION      Family History  Problem Relation Age of Onset  . Anemia Maternal Aunt   . Parkinson's disease Father   . Colon polyps Mother   . Colon polyps Sister     Social History   Socioeconomic History  . Marital status: Single    Spouse name: Not on file  . Number of children: 0  . Years of education: Not on file  . Highest education level: Not on file  Occupational History  . Not on file  Social Needs  . Financial resource strain: Not hard at all  . Food insecurity:    Worry: Never true    Inability: Never true  . Transportation needs:    Medical: No    Non-medical: No  Tobacco Use  . Smoking status: Former Smoker    Types: Cigarettes    Last attempt to quit: 12/22/2013    Years since quitting: 5.3  . Smokeless tobacco: Former Systems developer    Quit date: 11/21/2013  Substance and Sexual Activity  . Alcohol use: No    Alcohol/week: 0.0 standard drinks  . Drug use: No  . Sexual activity: Not on file  Lifestyle  . Physical activity:    Days per week: 0 days    Minutes per session: 0 min  . Stress: Not at all  Relationships  . Social connections:    Talks on phone: More than three times a week    Gets  together: Three times a week    Attends religious service: Never    Active member of club or organization: No    Attends meetings of clubs or organizations: Never    Relationship status: Living with partner  . Intimate partner violence:    Fear of current or ex partner: No    Emotionally abused: No    Physically abused: No    Forced sexual activity: No  Other Topics Concern  . Not on file  Social History Narrative  . Not on file    Patient Active Problem List   Diagnosis Date Noted  . Special screening for malignant neoplasms, colon   . Benign neoplasm of ascending colon   . Benign neoplasm of transverse colon   . Polyp of sigmoid colon   . Trigger  index finger of left hand 12/25/2017  . Mild single current episode of major depressive disorder ([place]) 11/27/2016  . Varicose veins of both lower extremities with pain 11/08/2015  . Tobacco use disorder, moderate, in sustained remission 06/06/2015  . Transient ischemic attack (TIA) 06/06/2015  . Patent arterial duct 06/06/2015  . Superficial thrombophlebitis of leg 06/06/2015    Home Medications:    Current Meds  Medication Sig  . escitalopram (LEXAPRO) 10 MG tablet Take 1 tablet (10 mg total) by mouth daily.    Allergies:   Patient has no known allergies.  Review of Systems (ROS): Review of Systems  Constitutional: Positive for activity change (increased physical activity on Monday of this week leading to back pain exacerbation). Negative for chills and fever.  Respiratory: Negative for cough and shortness of breath.   Cardiovascular: Negative for chest pain and palpitations.  Musculoskeletal: Positive for back pain. Negative for myalgias and neck pain.  Neurological: Negative for weakness and numbness.  All other systems reviewed and are negative.    Physical Exam:  Triage Vital Signs ED Triage Vitals  Enc Vitals Group     BP 04/29/19 1501 109/74     Pulse Rate 04/29/19 1501 62     Resp --      Temp 04/29/19 1501 98.2 F (36.8 C)     Temp Source 04/29/19 1501 Oral     SpO2 04/29/19 1501 98 %     Weight 04/29/19 1459 205 lb (93 kg)     Height 04/29/19 1459 6\' 1"  (1.854 m)     Head Circumference --      Peak Flow --      Pain Score 04/29/19 1459 3     Pain Loc --      Pain Edu? --      Excl. in [place]? --     Physical Exam  Constitutional: He is oriented to person, place, and time and well-developed, well-nourished, and in no distress.  HENT:  Head: Normocephalic and atraumatic.  Mouth/Throat: Mucous membranes are normal.  Cardiovascular: Normal rate, regular rhythm, normal heart sounds and intact distal pulses. Exam reveals no gallop and no friction rub.  No  murmur heard. Pulmonary/Chest: Effort normal and breath sounds normal. No respiratory distress. He has no wheezes. He has no rales.  Musculoskeletal:     Lumbar back: He exhibits tenderness, pain and spasm. He exhibits no deformity and normal pulse.  Neurological: He is alert and oriented to person, place, and time.  Skin: Skin is warm and dry. No rash noted. No erythema.  Psychiatric: Mood, affect and judgment normal.  Nursing note and vitals reviewed.    Urgent Care Treatments /  Results:   LABS: PLEASE NOTE: all labs that were ordered this encounter are listed, however only abnormal results are displayed. Labs Reviewed - No data to display  EKG: No orders found for this or any previous visit.  RADIOLOGY: No results found.  PRODEDURES: Procedures  MEDICATIONS RECEIVED THIS VISIT: Medications - No data to display  PERTINENT CLINICAL COURSE NOTES/UPDATES: No data to display   Initial Impression / Assessment and Plan / Urgent Care Course:    FREDERIK STANDLEY is a 53 y.o. male who presents to [facility] today with complaints of Back Pain  Pertinent labs & imaging results that were available during my care of the patient were personally reviewed by me and considered in my medical decision making (see lab/imaging section of note for values and interpretations).  Exam consistent with lumbosacral strain. Patient notes difficulty sleeping. (+) paraspinous muscle tenderness with concurrent spasm noted. LFTs elevated with on last CMP done by PCP. Will avoid APAP. Will prescribe anti-inflammatory (ketorolac) and SMR (robaxin) PRN pain and muscle spasm. Patient encouraged to continue alternating heat and ice for added pain relief. He was encouraged to rest and allow his back time to heal.   Discussed follow up with primary care physician in 1 week for re-evaluation. I have reviewed the follow up and strict return precautions for any new or worsening symptoms. Patient is aware of  symptoms that would be deemed urgent/emergent, and would thus require further evaluation either here or in the emergency department. At the time of discharge, he verbalized understanding and consent with the discharge plan as it was reviewed with him. All questions were fielded by provider and/or clinic staff prior to patient discharge.    Final Clinical Impressions(s) / Urgent Care Diagnoses:   Final diagnoses:  Strain of lumbar region, initial encounter  Muscle spasm of back    New Prescriptions:   Meds ordered this encounter  Medications  . ketorolac (TORADOL) 10 MG tablet    Sig: Take 1 tablet (10 mg total) by mouth every 6 (six) hours as needed.    Dispense:  20 tablet    Refill:  0  . methocarbamol (ROBAXIN) 500 MG tablet    Sig: Take 1 tablet (500 mg total) by mouth 2 (two) times daily.    Dispense:  20 tablet    Refill:  0    Controlled Substance Prescriptions:  [place] Controlled Substance Registry consulted? Not Applicable  NOTE: This note was prepared using Dragon dictation software along with smaller phrase technology. Despite my best ability to proofread, there is the potential that transcriptional errors may still occur from this process, and are completely unintentional.     Karen Kitchens, NP 04/29/19 1535

## 2019-05-09 ENCOUNTER — Encounter: Payer: Self-pay | Admitting: Internal Medicine

## 2019-05-09 ENCOUNTER — Ambulatory Visit (INDEPENDENT_AMBULATORY_CARE_PROVIDER_SITE_OTHER): Payer: BLUE CROSS/BLUE SHIELD | Admitting: Internal Medicine

## 2019-05-09 ENCOUNTER — Other Ambulatory Visit: Payer: Self-pay

## 2019-05-09 VITALS — BP 98/60 | HR 78 | Ht 73.0 in | Wt 200.6 lb

## 2019-05-09 DIAGNOSIS — M533 Sacrococcygeal disorders, not elsewhere classified: Secondary | ICD-10-CM

## 2019-05-09 MED ORDER — METHOCARBAMOL 500 MG PO TABS: 500.0000 mg | ORAL_TABLET | Freq: Every day | ORAL | 1 refills | Status: DC

## 2019-05-09 MED ORDER — PREDNISONE 10 MG PO TABS: 10.0000 mg | ORAL_TABLET | ORAL | 0 refills | Status: AC

## 2019-05-09 NOTE — Progress Notes (Signed)
Date:  05/09/2019   Name:  Bob Mills   DOB:  1966-02-27   MRN:  [id number]   Chief Complaint: Back Pain (Better than was. Was given toradol and skelaxon. Helped slightly. But pain is still there. Pain is located all across lower back. Pain radiates up the left side of spine. Can radiate into right shoulder blade. In last 2 days feels like would be coming from right ankle because that is hurting. )  Back Pain  This is a new problem. The current episode started 1 to 4 weeks ago. The problem occurs constantly. The problem has been gradually improving since onset. The pain is present in the lumbar spine. The quality of the pain is described as aching. Radiates to: up the right side of his back. The pain is mild. The symptoms are aggravated by standing and twisting. Pertinent negatives include no chest pain, fever, headaches, numbness or weakness. He has tried bed rest, NSAIDs and muscle relaxant for the symptoms. The treatment provided mild relief.  Seen by UC 10 days ago and prescribed Toradol and Robaxin.  He has been working at [employer] where the counters are low and he was having to bend over more.  He has tried heat and that has helped.  He thinks that tramadol and robaxin helped to some degree.  Review of Systems  Constitutional: Negative for chills, fatigue and fever.  Respiratory: Negative for cough, chest tightness, shortness of breath and wheezing.   Cardiovascular: Negative for chest pain and palpitations.  Musculoskeletal: Positive for back pain.  Neurological: Negative for dizziness, weakness, numbness and headaches.    Patient Active Problem List   Diagnosis Date Noted  . Sacro-iliac pain 05/09/2019  . Special screening for malignant neoplasms, colon   . Benign neoplasm of ascending colon   . Benign neoplasm of transverse colon   . Polyp of sigmoid colon   . Trigger index finger of left hand 12/25/2017  . Mild single current episode of major depressive disorder  ([place]) 11/27/2016  . Varicose veins of both lower extremities with pain 11/08/2015  . Tobacco use disorder, moderate, in sustained remission 06/06/2015  . Transient ischemic attack (TIA) 06/06/2015  . Patent arterial duct 06/06/2015  . Superficial thrombophlebitis of leg 06/06/2015    No Known Allergies  Past Surgical History:  Procedure Laterality Date  . COLONOSCOPY  2008  . COLONOSCOPY WITH PROPOFOL N/A 09/09/2018   Procedure: COLONOSCOPY WITH PROPOFOL WITH BIOPSY;  Surgeon: [name], MD;  Location: [facility];  Service: Endoscopy;  Laterality: N/A;  . POLYPECTOMY N/A 09/09/2018   Procedure: POLYPECTOMY;  Surgeon: [name], MD;  Location: [facility];  Service: Endoscopy;  Laterality: N/A;  . VASCULAR SURGERY  03/22/2016  . WISDOM TOOTH EXTRACTION      Social History   Tobacco Use  . Smoking status: Former Smoker    Types: Cigarettes    Last attempt to quit: 12/22/2013    Years since quitting: 5.3  . Smokeless tobacco: Former Systems developer    Quit date: 11/21/2013  Substance Use Topics  . Alcohol use: No    Alcohol/week: 0.0 standard drinks  . Drug use: No     Medication list has been reviewed and updated.  Current Meds  Medication Sig  . escitalopram (LEXAPRO) 10 MG tablet Take 1 tablet (10 mg total) by mouth daily.    PHQ 2/9 Scores 05/09/2019 12/30/2018 12/25/2017  PHQ - 2 Score 0 0 0  PHQ- 9 Score - -  0    BP Readings from Last 3 Encounters:  05/09/19 98/60  04/29/19 109/74  12/30/18 128/70    Physical Exam Vitals signs and nursing note reviewed.  Constitutional:      General: He is not in acute distress.    Appearance: He is well-developed.  HENT:     Head: Normocephalic and atraumatic.  Neck:     Musculoskeletal: Normal range of motion and neck supple.  Cardiovascular:     Rate and Rhythm: Normal rate and regular rhythm.  Pulmonary:     Effort: Pulmonary effort is normal. No respiratory distress.     Breath sounds: Normal breath  sounds.  Musculoskeletal: Normal range of motion.     Lumbar back: He exhibits spasm. He exhibits no bony tenderness.     Comments: Tender over the left SI joint to palpation SLR slightly positive on the left  Lymphadenopathy:     Cervical: No cervical adenopathy.  Skin:    General: Skin is warm and dry.     Findings: No rash.  Neurological:     Mental Status: He is alert and oriented to person, place, and time.     Cranial Nerves: Cranial nerves are intact.     Sensory: Sensation is intact.     Motor: Motor function is intact.     Deep Tendon Reflexes:     Reflex Scores:      Patellar reflexes are 2+ on the right side and 2+ on the left side. Psychiatric:        Behavior: Behavior normal.        Thought Content: Thought content normal.     Wt Readings from Last 3 Encounters:  05/09/19 200 lb 9.6 oz (91 kg)  04/29/19 205 lb (93 kg)  12/30/18 215 lb 3.2 oz (97.6 kg)    BP 98/60   Pulse 78   Ht 6\' 1"  (1.854 m)   Wt 200 lb 9.6 oz (91 kg)   SpO2 97%   BMI 26.47 kg/m   Assessment and Plan: 1. Sacro-iliac pain Continue heat; try Thermacare wraps Continue Advil once prednisone taper is complete - methocarbamol (ROBAXIN) 500 MG tablet; Take 1 tablet (500 mg total) by mouth at bedtime.  Dispense: 30 tablet; Refill: 1 - predniSONE (DELTASONE) 10 MG tablet; Take 1 tablet (10 mg total) by mouth as directed for 6 days. Take 6,5,4,3,2,1 then stop  Dispense: 21 tablet; Refill: 0   Partially dictated using Editor, commissioning. Any errors are unintentional.  Halina Maidens, MD [facility]  05/09/2019

## 2019-05-09 NOTE — Patient Instructions (Addendum)
Once you finish the prednisone taper, go back to taking Ibuprofen 400-600 mg three times per day  Try Thermacare heat wraps

## 2019-05-27 ENCOUNTER — Telehealth: Payer: Self-pay

## 2019-05-27 NOTE — Telephone Encounter (Signed)
Patient called saying he was seen last week for back pain and given steroids to help. He said he got the medication from the pharmacy and is afraid to take it. He read the package insert papers and found out that the medication can cause irritability and lower immune system. He wants to come back to the office for a Toradol shot.  Called patient back and informed we do not give Toradol injections. He would need to be seen in an urgent care to discuss with those doctors receiving this type of shot. Also, told him those side effects are typically for people who are on steroids for a long period of time. Told him he will be only taking as a tapered dose and he should be fine. Told him Dr. Army Melia would want him to try the medication. He said he will think about it.

## 2019-05-28 ENCOUNTER — Encounter: Payer: Self-pay | Admitting: Emergency Medicine

## 2019-05-28 ENCOUNTER — Other Ambulatory Visit: Payer: Self-pay

## 2019-05-28 ENCOUNTER — Ambulatory Visit
Admission: EM | Admit: 2019-05-28 | Discharge: 2019-05-28 | Disposition: A | Discharge status: Home / Self Care | Payer: BLUE CROSS/BLUE SHIELD | Attending: Urgent Care | Admitting: Urgent Care

## 2019-05-28 DIAGNOSIS — M5442 Lumbago with sciatica, left side: Secondary | ICD-10-CM

## 2019-05-28 MED ORDER — KETOROLAC TROMETHAMINE 60 MG/2ML IM SOLN
60.0000 mg | Freq: Once | INTRAMUSCULAR | Status: AC
  Administered 2019-05-28: 60 mg via INTRAMUSCULAR

## 2019-05-28 NOTE — Discharge Instructions (Addendum)
It was very nice meeting you today in clinic. Thank you for entrusting me with your care.   As discussed, I think that you pain has progressed since the last time that I saw you. You have developed some sciatica. I really feel as if you shoulder consider taking the prescribed steroids. I gave you the anti-inflammatory shot (Toradol) as requested. Continue warm compresses and previously prescribed interventions.   Make arrangements to follow up with your regular doctor in 1 week for re-evaluation if not improving. If your symptoms/condition worsens, please seek follow up care either here or in the ER. Please remember, our [facility] providers are "right here with you" when you need [place].   Again, it was my pleasure to take care of you today. Thank you for choosing our clinic. I hope that you start to feel better quickly.   Honor Loh, MSN, APRN, FNP-C, CEN Advanced Practice Provider [facility]

## 2019-05-28 NOTE — ED Provider Notes (Signed)
[address] [place], [place] [address] [phone]   Name: Bob Mills DOB: 23-Aug-1966 MRN: [id number] CSN: [id number] PCP: Glean Hess, MD  Arrival date and time:  05/28/19 1256  Chief Complaint:  Back Pain  NOTE: Prior to seeing the patient today, I have reviewed the triage nursing documentation and vital signs. Clinical staff has updated patient's PMH/PSHx, current medication list, and drug allergies/intolerances to ensure comprehensive history available to assist in medical decision making.   History:   HPI: Bob Mills is a 53 y.o. male who presents today with complaints of persistent lower back pain.  Patient was seen here at an urgent care on 04/29/2019 by myself for acute back pain that followed an increased in physical activity beyond patient's baseline. He was diagnosed with lumbosacral strain and provided prescriptions for ketorolac and methocarbamol.  Patient presented to PCP on 05/09/2019 noting that the prescribed interventions had not relieved his back pain.  Patient was prescribed an extended course of methocarbamol and a prednisone taper, and advised to continue ibuprofen and heat application.    Patient presents today noting that his pain is "different" than when he was previously seen by this provider.  He notes now is radiating down the posterior aspect of his LEFT lower extremity.  Patient denies any issues with bowel or bladder incontinence.  No saddle anesthesia.  Patient ambulating safely with no significant weakness in his lower extremities.  Again, patient has never had any sort of back injuries requiring surgical intervention.  Provoking/exacerbating factors continue to include movement, standing, and prolonged sitting.  Position of comfort continues to be supine.  Interventions, thus far, from both urgent care and primary care providers have only minimally improved patient symptoms. Of note, patient never filled the prescription for the  steroids prescribed by Dr. Army Melia citing that he did some research and found out that they can be associated with altered immunity and no changes.  Patient states, "with the [place] going around I need my immune system to be as strong as it can be.  My significant other is a Marine scientist at [employer].  I do not need anything knocking my immune system down.  Also, I am already on antidepressants, I do not need anything altering my mood.  Even if it is short-term".  Patient presents today requesting a ketorolac injection..  Patient cites that he has received this intervention in the past for musculoskeletal pain and it was effective.  Past Medical History:  Diagnosis Date   Complication of anesthesia    "anesthesia doesn't work"   Depression    Motion sickness    ocean ships   Patent arterial duct 06/06/2015   TIA (transient ischemic attack)    Varicose veins of bilateral lower extremities with pain    Wears dentures    partial upper (1 tooth)    Past Surgical History:  Procedure Laterality Date   COLONOSCOPY  2008   COLONOSCOPY WITH PROPOFOL N/A 09/09/2018   Procedure: COLONOSCOPY WITH PROPOFOL WITH BIOPSY;  Surgeon: [name], MD;  Location: [place];  Service: Endoscopy;  Laterality: N/A;   POLYPECTOMY N/A 09/09/2018   Procedure: POLYPECTOMY;  Surgeon: [name], MD;  Location: [place];  Service: Endoscopy;  Laterality: N/A;   VASCULAR SURGERY  03/22/2016   WISDOM TOOTH EXTRACTION      Family History  Problem Relation Age of Onset   Anemia Maternal Aunt    Parkinson's disease Father    Colon polyps Mother  Colon polyps Sister     Social History   Socioeconomic History   Marital status: Single    Spouse name: Not on file   Number of children: 0   Years of education: Not on file   Highest education level: Not on file  Occupational History   Not on file  Social Needs   Financial resource strain: Not hard at all   Food insecurity:      Worry: Never true    Inability: Never true   Transportation needs:    Medical: No    Non-medical: No  Tobacco Use   Smoking status: Former Smoker    Types: Cigarettes    Last attempt to quit: 12/22/2013    Years since quitting: 5.4   Smokeless tobacco: Former Systems developer    Quit date: 11/21/2013  Substance and Sexual Activity   Alcohol use: No    Alcohol/week: 0.0 standard drinks   Drug use: No   Sexual activity: Not on file  Lifestyle   Physical activity:    Days per week: 0 days    Minutes per session: 0 min   Stress: Not at all  Relationships   Social connections:    Talks on phone: More than three times a week    Gets together: Three times a week    Attends religious service: Never    Active member of club or organization: No    Attends meetings of clubs or organizations: Never    Relationship status: Living with partner   Intimate partner violence:    Fear of current or ex partner: No    Emotionally abused: No    Physically abused: No    Forced sexual activity: No  Other Topics Concern   Not on file  Social History Narrative   Not on file    Patient Active Problem List   Diagnosis Date Noted   Sacro-iliac pain 05/09/2019   Special screening for malignant neoplasms, colon    Benign neoplasm of ascending colon    Benign neoplasm of transverse colon    Polyp of sigmoid colon    Trigger index finger of left hand 12/25/2017   Mild single current episode of major depressive disorder ([place]) 11/27/2016   Varicose veins of both lower extremities with pain 11/08/2015   Tobacco use disorder, moderate, in sustained remission 06/06/2015   Transient ischemic attack (TIA) 06/06/2015   Patent arterial duct 06/06/2015   Superficial thrombophlebitis of leg 06/06/2015    Home Medications:    Current Meds  Medication Sig   escitalopram (LEXAPRO) 10 MG tablet Take 1 tablet (10 mg total) by mouth daily.   methocarbamol (ROBAXIN) 500 MG tablet Take 1  tablet (500 mg total) by mouth at bedtime.    Allergies:   Patient has no known allergies.  Review of Systems (ROS): Review of Systems  Constitutional: Positive for activity change (reduced 2/2 persistent pain in lower back). Negative for chills and fever.  Respiratory: Negative for cough and shortness of breath.   Cardiovascular: Negative for chest pain and palpitations.  Musculoskeletal: Positive for back pain.  Neurological: Positive for numbness (LLE). Negative for weakness.     Physical Exam:  Triage Vital Signs ED Triage Vitals  Enc Vitals Group     BP 05/28/19 1314 121/84     Pulse Rate 05/28/19 1314 82     Resp 05/28/19 1314 18     Temp 05/28/19 1314 99.3 F (37.4 C)     Temp Source 05/28/19  1314 Oral     SpO2 05/28/19 1314 98 %     Weight 05/28/19 1315 200 lb (90.7 kg)     Height 05/28/19 1315 6\' 1"  (1.854 m)     Head Circumference --      Peak Flow --      Pain Score 05/28/19 1314 3     Pain Loc --      Pain Edu? --      Excl. in [place]? --     Physical Exam  Constitutional: He is oriented to person, place, and time and well-developed, well-nourished, and in no distress.  HENT:  Head: Normocephalic and atraumatic.  Mouth/Throat: Oropharynx is clear and moist and mucous membranes are normal.  Eyes: Pupils are equal, round, and reactive to light. EOM are normal.  Neck: Normal range of motion. Neck supple. No spinous process tenderness and no muscular tenderness present. No tracheal deviation present.  Cardiovascular: Normal rate, regular rhythm, normal heart sounds and intact distal pulses. Exam reveals no gallop and no friction rub.  No murmur heard. Pulmonary/Chest: Effort normal and breath sounds normal. No respiratory distress. He has no wheezes. He has no rales.  Musculoskeletal:     Lumbar back: He exhibits tenderness (over SI joint) and pain (radicular pain extending from lower back down the posterior aspect of LLE). He exhibits no swelling, no spasm and  normal pulse.  Neurological: He is alert and oriented to person, place, and time. He has normal motor skills, normal sensation, normal strength and normal reflexes. He has an abnormal Straight Leg Raise Test (LEFT).  Skin: Skin is warm and dry. No rash noted. No erythema.  Psychiatric: Mood, affect and judgment normal.  Nursing note and vitals reviewed.    Urgent Care Treatments / Results:   LABS: PLEASE NOTE: all labs that were ordered this encounter are listed, however only abnormal results are displayed. Labs Reviewed - No data to display  EKG: -None  RADIOLOGY: No results found.  PRODEDURES: Procedures  MEDICATIONS RECEIVED THIS VISIT: Medications  ketorolac (TORADOL) injection 60 mg (60 mg Intramuscular Given 05/28/19 1406)    PERTINENT CLINICAL COURSE NOTES/UPDATES:   Initial Impression / Assessment and Plan / Urgent Care Course:    Bob Mills is a 53 y.o. male who presents to [facility] today with complaints of Back Pain  Pertinent labs & imaging results that were available during my care of the patient were personally reviewed by me and considered in my medical decision making (see lab/imaging section of note for values and interpretations).  Exam reveals tenderness over patient's SI joint.  Patient's pain has progressed to the point where it is now radiating into the posterior aspect of his LLE.  No weakness, saddale anesthesia, or bowel/bladder incontinence. He is on daily SMR therapy and using heat as advised.  Patient is requesting ketorolac injection in clinic today.  Request is reasonable given his persistent pain.  Patient given ketorolac 60 mg IM. Patient has been prescribed systemic steroid course by his PCP, however he is hesitant to start due to side effects that he read about on the internet.  Patient educated on short-term use of steroids and encouraged to fill prescription and utilize medication as prescribed to help with his pain in the setting  of obvious lumbar radiculopathy. He will continue the NSAIDs, SMRs, and hopefully the prescribed systemic steroid course. If interventions ineffective, patient to follow up with PCP to discuss possible MRI of lumbar spine to assess further.  I have reviewed the follow up and strict return precautions for any new or worsening symptoms. Patient is aware of symptoms that would be deemed urgent/emergent, and would thus require further evaluation either here or in the emergency department. At the time of discharge, he verbalized understanding and consent with the discharge plan as it was reviewed with him. All questions were fielded by provider and/or clinic staff prior to patient discharge.    Final Clinical Impressions(s) / Urgent Care Diagnoses:   Final diagnoses:  Acute midline low back pain with left-sided sciatica    New Prescriptions:   Meds ordered this encounter  Medications   ketorolac (TORADOL) injection 60 mg    Controlled Substance Prescriptions:  [place] Controlled Substance Registry consulted? Not Applicable  NOTE: This note was prepared using Dragon dictation software along with smaller phrase technology. Despite my best ability to proofread, there is the potential that transcriptional errors may still occur from this process, and are completely unintentional.     Karen Kitchens, NP 05/28/19 1642

## 2019-05-28 NOTE — ED Triage Notes (Signed)
Patient states he continues to have back pain from his visit on 05/08. He states he followed up with his PCP and they gave him a script for Prednisone. He states he does not want to take this because of COVID and he is afraid it will decreased him immune system and he will get sick. He is requesting a Toradol injection.

## 2019-12-27 ENCOUNTER — Other Ambulatory Visit: Payer: Self-pay | Admitting: Internal Medicine

## 2019-12-27 DIAGNOSIS — F320 Major depressive disorder, single episode, mild: Secondary | ICD-10-CM

## 2020-01-04 ENCOUNTER — Encounter: Payer: BLUE CROSS/BLUE SHIELD | Admitting: Internal Medicine

## 2020-02-09 ENCOUNTER — Encounter: Payer: BLUE CROSS/BLUE SHIELD | Admitting: Internal Medicine

## 2020-03-02 ENCOUNTER — Other Ambulatory Visit: Payer: Self-pay

## 2020-03-02 ENCOUNTER — Encounter: Payer: Self-pay | Admitting: Internal Medicine

## 2020-03-02 ENCOUNTER — Ambulatory Visit (INDEPENDENT_AMBULATORY_CARE_PROVIDER_SITE_OTHER): Payer: BLUE CROSS/BLUE SHIELD | Admitting: Internal Medicine

## 2020-03-02 VITALS — BP 98/60 | HR 60 | Temp 98.2°F | Resp 16 | Ht 72.0 in | Wt 199.4 lb

## 2020-03-02 DIAGNOSIS — F17201 Nicotine dependence, unspecified, in remission: Secondary | ICD-10-CM | POA: Diagnosis not present

## 2020-03-02 DIAGNOSIS — R7989 Other specified abnormal findings of blood chemistry: Secondary | ICD-10-CM | POA: Diagnosis not present

## 2020-03-02 DIAGNOSIS — Z125 Encounter for screening for malignant neoplasm of prostate: Secondary | ICD-10-CM

## 2020-03-02 DIAGNOSIS — F320 Major depressive disorder, single episode, mild: Secondary | ICD-10-CM | POA: Diagnosis not present

## 2020-03-02 DIAGNOSIS — Z0000 Encounter for general adult medical examination without abnormal findings: Secondary | ICD-10-CM

## 2020-03-02 LAB — POCT URINALYSIS DIPSTICK
Bilirubin, UA: NEGATIVE
Blood, UA: NEGATIVE
Glucose, UA: NEGATIVE
Ketones, UA: NEGATIVE
Leukocytes, UA: NEGATIVE
Nitrite, UA: NEGATIVE
Protein, UA: NEGATIVE
Spec Grav, UA: 1.02 (ref 1.010–1.025)
Urobilinogen, UA: 0.2 E.U./dL
pH, UA: 6 (ref 5.0–8.0)

## 2020-03-02 MED ORDER — ESCITALOPRAM OXALATE 10 MG PO TABS: 15.0000 mg | ORAL_TABLET | Freq: Every day | ORAL | 1 refills | Status: DC

## 2020-03-02 NOTE — Progress Notes (Signed)
Date:  03/02/2020   Name:  Bob Mills   DOB:  Jan 29, 1966   MRN:  [id number]   Chief Complaint: Annual Exam (no complaints today ) OSBON SVAY is a 54 y.o. male who presents today for his Complete Annual Exam. He feels fairly well. He reports exercising rarely. He reports he is sleeping fairly well.   Colonoscopy  08/2018 Immunization History  Administered Date(s) Administered  . Influenza, Quadrivalent, Recombinant, Inj, Pf 12/02/2018  . Influenza,inj,Quad PF,6+ Mos 09/10/2015, 09/21/2017  . Influenza-Unspecified 09/10/2017, 10/22/2018, 09/22/2019  . Janssen (J&J) SARS-COV-2 Vaccination 02/28/2020  . Tdap 10/26/2015    Depression        This is a chronic problem.  The problem occurs daily.  Associated symptoms include insomnia, irritable and decreased interest.  Associated symptoms include no fatigue, no hopelessness, no appetite change, no myalgias, no headaches and no suicidal ideas.  Past treatments include SSRIs - Selective serotonin reuptake inhibitors.  Compliance with treatment is good.   Lab Results  Component Value Date   CREATININE 0.66 (L) 12/30/2018   BUN 9 12/30/2018   NA 138 12/30/2018   K 4.5 12/30/2018   CL 100 12/30/2018   CO2 24 12/30/2018   Lab Results  Component Value Date   CHOL 199 12/30/2018   HDL 54 12/30/2018   LDLCALC 132 (H) 12/30/2018   TRIG 67 12/30/2018   CHOLHDL 3.7 12/30/2018   Lab Results  Component Value Date   TSH 2.080 06/08/2015   No results found for: HGBA1C   Review of Systems  Constitutional: Negative for appetite change, chills, diaphoresis, fatigue and unexpected weight change.  HENT: Negative for hearing loss, tinnitus, trouble swallowing and voice change.   Eyes: Negative for visual disturbance.  Respiratory: Negative for choking, shortness of breath and wheezing.   Cardiovascular: Negative for chest pain, palpitations and leg swelling.  Gastrointestinal: Negative for abdominal pain, blood in stool,  constipation and diarrhea.  Genitourinary: Negative for decreased urine volume, difficulty urinating, dysuria and frequency.  Musculoskeletal: Negative for arthralgias, back pain and myalgias.  Skin: Negative for color change and rash.  Allergic/Immunologic: Negative for environmental allergies.  Neurological: Negative for dizziness, syncope and headaches.  Hematological: Negative for adenopathy.  Psychiatric/Behavioral: Positive for depression. Negative for dysphoric mood, sleep disturbance and suicidal ideas. The patient has insomnia.     Patient Active Problem List   Diagnosis Date Noted  . Sacro-iliac pain 05/09/2019  . Special screening for malignant neoplasms, colon   . Benign neoplasm of ascending colon   . Benign neoplasm of transverse colon   . Polyp of sigmoid colon   . Trigger index finger of left hand 12/25/2017  . Mild single current episode of major depressive disorder ([place]) 11/27/2016  . Varicose veins of both lower extremities with pain 11/08/2015  . Tobacco use disorder, moderate, in sustained remission 06/06/2015  . Transient ischemic attack (TIA) 06/06/2015  . Patent arterial duct 06/06/2015  . Superficial thrombophlebitis of leg 06/06/2015    No Known Allergies  Past Surgical History:  Procedure Laterality Date  . COLONOSCOPY  2008  . COLONOSCOPY WITH PROPOFOL N/A 09/09/2018   Procedure: COLONOSCOPY WITH PROPOFOL WITH BIOPSY;  Surgeon: [name], MD;  Location: [place];  Service: Endoscopy;  Laterality: N/A;  . POLYPECTOMY N/A 09/09/2018   Procedure: POLYPECTOMY;  Surgeon: [name], MD;  Location: [place];  Service: Endoscopy;  Laterality: N/A;  . VASCULAR SURGERY  03/22/2016  . WISDOM TOOTH EXTRACTION  Social History   Tobacco Use  . Smoking status: Former Smoker    Types: Cigarettes    Quit date: 12/22/2013    Years since quitting: 6.1  . Smokeless tobacco: Former Systems developer    Quit date: 11/21/2013  Substance Use Topics    . Alcohol use: No    Alcohol/week: 0.0 standard drinks  . Drug use: No     Medication list has been reviewed and updated.  Current Meds  Medication Sig  . escitalopram (LEXAPRO) 10 MG tablet Take 1 tablet by mouth once daily    PHQ 2/9 Scores 03/02/2020 05/09/2019 12/30/2018 12/25/2017  PHQ - 2 Score 2 0 0 0  PHQ- 9 Score 8 - - 0    BP Readings from Last 3 Encounters:  03/02/20 98/60  05/28/19 121/84  05/09/19 98/60    Physical Exam Vitals and nursing note reviewed.  Constitutional:      General: He is irritable.     Appearance: Normal appearance. He is well-developed.  HENT:     Head: Normocephalic.     Right Ear: Tympanic membrane, ear canal and external ear normal.     Left Ear: Tympanic membrane, ear canal and external ear normal.     Nose: Nose normal.     Mouth/Throat:     Pharynx: Uvula midline.  Eyes:     Conjunctiva/sclera: Conjunctivae normal.     Pupils: Pupils are equal, round, and reactive to light.  Neck:     Thyroid: No thyromegaly.     Vascular: No carotid bruit.  Cardiovascular:     Rate and Rhythm: Normal rate and regular rhythm.     Pulses: Normal pulses.     Heart sounds: Normal heart sounds. No murmur.  Pulmonary:     Effort: Pulmonary effort is normal.     Breath sounds: Normal breath sounds. No wheezing.  Chest:     Breasts:        Right: No mass.        Left: No mass.  Abdominal:     General: Bowel sounds are normal.     Palpations: Abdomen is soft.     Tenderness: There is no abdominal tenderness.  Musculoskeletal:        General: Normal range of motion.     Cervical back: Normal range of motion and neck supple.     Right lower leg: No edema.     Left lower leg: No edema.  Lymphadenopathy:     Cervical: No cervical adenopathy.  Skin:    General: Skin is warm and dry.     Capillary Refill: Capillary refill takes less than 2 seconds.  Neurological:     General: No focal deficit present.     Mental Status: He is alert and oriented  to person, place, and time.     Deep Tendon Reflexes: Reflexes are normal and symmetric.  Psychiatric:        Speech: Speech normal.        Behavior: Behavior normal.        Thought Content: Thought content normal.        Judgment: Judgment normal.     Wt Readings from Last 3 Encounters:  03/02/20 199 lb 6 oz (90.4 kg)  05/28/19 200 lb (90.7 kg)  05/09/19 200 lb 9.6 oz (91 kg)    BP 98/60   Pulse 60   Temp 98.2 F (36.8 C) (Oral)   Resp 16   Ht 6' (1.829 m)  Wt 199 lb 6 oz (90.4 kg)   SpO2 97%   BMI 27.04 kg/m   Assessment and Plan: 1. Annual physical exam Normal exam except for weight Work on diet improvement - Lipid panel - POCT urinalysis dipstick  2. Prostate cancer screening DRE deferred - PSA  3. Mild single current episode of major depressive disorder ([place]) Doing fairly well but with some recurrent symptoms Will increase lexapro to 15 mg per day - follow up if needed - TSH - escitalopram (LEXAPRO) 10 MG tablet; Take 1.5 tablets (15 mg total) by mouth daily.  Dispense: 135 tablet; Refill: 1  4. Elevated liver function tests Recheck today Continue to avoid regular high doses of tylenol - CBC with Differential/Platelet - Comprehensive metabolic panel  5. Tobacco use disorder, moderate, in sustained remission He qualifies for LDCT screening and is interested so will send a message to [employer]   Partially dictated using [employer]. Any errors are unintentional.  Halina Maidens, MD [employer]  03/02/2020

## 2020-03-03 LAB — COMPREHENSIVE METABOLIC PANEL
ALT: 14 IU/L (ref 0–44)
AST: 22 IU/L (ref 0–40)
Albumin/Globulin Ratio: 1.8 (ref 1.2–2.2)
Albumin: 4.4 g/dL (ref 3.8–4.9)
Alkaline Phosphatase: 90 IU/L (ref 39–117)
BUN/Creatinine Ratio: 12 (ref 9–20)
BUN: 9 mg/dL (ref 6–24)
Bilirubin Total: 0.3 mg/dL (ref 0.0–1.2)
CO2: 21 mmol/L (ref 20–29)
Calcium: 9.1 mg/dL (ref 8.7–10.2)
Chloride: 102 mmol/L (ref 96–106)
Creatinine, Ser: 0.76 mg/dL (ref 0.76–1.27)
GFR calc Af Amer: 121 mL/min/{1.73_m2} (ref >59)
GFR calc non Af Amer: 105 mL/min/{1.73_m2} (ref >59)
Globulin, Total: 2.5 g/dL (ref 1.5–4.5)
Glucose: 93 mg/dL (ref 65–99)
Potassium: 4.4 mmol/L (ref 3.5–5.2)
Sodium: 137 mmol/L (ref 134–144)
Total Protein: 6.9 g/dL (ref 6.0–8.5)

## 2020-03-03 LAB — CBC WITH DIFFERENTIAL/PLATELET
Basophils Absolute: 0.1 10*3/uL (ref 0.0–0.2)
Basos: 1 %
EOS (ABSOLUTE): 0.1 10*3/uL (ref 0.0–0.4)
Eos: 2 %
Hematocrit: 38 % (ref 37.5–51.0)
Hemoglobin: 13.3 g/dL (ref 13.0–17.7)
Immature Grans (Abs): 0 10*3/uL (ref 0.0–0.1)
Immature Granulocytes: 0 %
Lymphocytes Absolute: 2.1 10*3/uL (ref 0.7–3.1)
Lymphs: 44 %
MCH: 30.6 pg (ref 26.6–33.0)
MCHC: 35 g/dL (ref 31.5–35.7)
MCV: 87 fL (ref 79–97)
Monocytes Absolute: 0.9 10*3/uL (ref 0.1–0.9)
Monocytes: 19 %
Neutrophils Absolute: 1.6 10*3/uL (ref 1.4–7.0)
Neutrophils: 34 %
Platelets: 225 10*3/uL (ref 150–450)
RBC: 4.35 x10E6/uL (ref 4.14–5.80)
RDW: 12.2 % (ref 11.6–15.4)
WBC: 4.8 10*3/uL (ref 3.4–10.8)

## 2020-03-03 LAB — LIPID PANEL
Chol/HDL Ratio: 2.8 ratio (ref 0.0–5.0)
Cholesterol, Total: 153 mg/dL (ref 100–199)
HDL: 55 mg/dL (ref >39)
LDL Chol Calc (NIH): 89 mg/dL (ref 0–99)
Triglycerides: 41 mg/dL (ref 0–149)
VLDL Cholesterol Cal: 9 mg/dL (ref 5–40)

## 2020-03-03 LAB — PSA: Prostate Specific Ag, Serum: 0.3 ng/mL (ref 0.0–4.0)

## 2020-03-03 LAB — TSH: TSH: 1.74 u[IU]/mL (ref 0.450–4.500)

## 2020-07-19 ENCOUNTER — Telehealth: Payer: Self-pay | Admitting: *Deleted

## 2020-07-19 NOTE — Telephone Encounter (Signed)
Contacted patient to discuss lung screening referral. Notified patient that many insurance companies are covering lung screening for patients between the age of 29 and 20 that were denied earlier in the year. Patient reports that we are out of network and will look into scheduling with an in network provider.

## 2020-12-21 ENCOUNTER — Other Ambulatory Visit: Payer: Self-pay | Admitting: Internal Medicine

## 2020-12-21 DIAGNOSIS — F320 Major depressive disorder, single episode, mild: Secondary | ICD-10-CM

## 2021-03-07 ENCOUNTER — Encounter: Payer: BLUE CROSS/BLUE SHIELD | Admitting: Internal Medicine

## 2021-05-13 ENCOUNTER — Other Ambulatory Visit: Payer: Self-pay | Admitting: Internal Medicine

## 2021-05-13 DIAGNOSIS — F320 Major depressive disorder, single episode, mild: Secondary | ICD-10-CM

## 2021-05-13 MED ORDER — ESCITALOPRAM OXALATE 10 MG PO TABS: ORAL_TABLET | ORAL | 0 refills | Status: DC

## 2021-05-13 NOTE — Telephone Encounter (Signed)
Pt has appt 05/27/21  Courtesy refill of #23  given until appt.

## 2021-05-13 NOTE — Telephone Encounter (Signed)
Pt called in to schedule a follow up visit with PCP. Pt says that his insurance is [employer]. However pt says that he has been unable to get an apt with a new provider. Pt ways that he would like to continue seeing Dr. Army Melia. Pt would like to know if provider would send in a refill of his medication until his apt?   Please advise.  Pharmacy:  [facility] [address], [place] Phone:  [phone]  Fax:  [phone]

## 2021-05-27 ENCOUNTER — Encounter: Payer: Self-pay | Admitting: Internal Medicine

## 2021-05-27 ENCOUNTER — Other Ambulatory Visit: Payer: Self-pay

## 2021-05-27 ENCOUNTER — Ambulatory Visit (INDEPENDENT_AMBULATORY_CARE_PROVIDER_SITE_OTHER): Payer: Self-pay | Admitting: Internal Medicine

## 2021-05-27 VITALS — BP 132/74 | HR 70 | Ht 72.0 in | Wt 202.0 lb

## 2021-05-27 DIAGNOSIS — F320 Major depressive disorder, single episode, mild: Secondary | ICD-10-CM

## 2021-05-27 MED ORDER — ESCITALOPRAM OXALATE 10 MG PO TABS: 15.0000 mg | ORAL_TABLET | Freq: Every day | ORAL | 1 refills | Status: DC

## 2021-05-27 NOTE — Progress Notes (Signed)
Date:  05/27/2021   Name:  Bob Mills   DOB:  1966/08/14   MRN:  [id number]   Chief Complaint: Depression  Depression        This is a chronic problem.The problem is unchanged.  Associated symptoms include no fatigue, no headaches and no suicidal ideas.  Past treatments include SSRIs - Selective serotonin reuptake inhibitors.  Compliance with treatment is good.  Previous treatment provided significant relief.   Lab Results  Component Value Date   CREATININE 0.76 03/02/2020   BUN 9 03/02/2020   NA 137 03/02/2020   K 4.4 03/02/2020   CL 102 03/02/2020   CO2 21 03/02/2020   Lab Results  Component Value Date   CHOL 153 03/02/2020   HDL 55 03/02/2020   LDLCALC 89 03/02/2020   TRIG 41 03/02/2020   CHOLHDL 2.8 03/02/2020   Lab Results  Component Value Date   TSH 1.740 03/02/2020   No results found for: HGBA1C Lab Results  Component Value Date   WBC 4.8 03/02/2020   HGB 13.3 03/02/2020   HCT 38.0 03/02/2020   MCV 87 03/02/2020   PLT 225 03/02/2020   Lab Results  Component Value Date   ALT 14 03/02/2020   AST 22 03/02/2020   ALKPHOS 90 03/02/2020   BILITOT 0.3 03/02/2020     Review of Systems  Constitutional: Negative for chills, fatigue and fever.  HENT: Negative for dental problem and trouble swallowing.   Respiratory: Negative for chest tightness and shortness of breath.   Cardiovascular: Negative for chest pain, palpitations and leg swelling.  Gastrointestinal: Negative for abdominal pain, constipation and diarrhea.  Musculoskeletal: Positive for back pain. Negative for arthralgias, gait problem and joint swelling.  Neurological: Negative for dizziness and headaches.  Psychiatric/Behavioral: Positive for depression. Negative for dysphoric mood, sleep disturbance and suicidal ideas. The patient is not nervous/anxious.     Patient Active Problem List   Diagnosis Date Noted  . Sacro-iliac pain 05/09/2019  . Special screening for malignant neoplasms,  colon   . Benign neoplasm of ascending colon   . Benign neoplasm of transverse colon   . Polyp of sigmoid colon   . Trigger index finger of left hand 12/25/2017  . Mild single current episode of major depressive disorder ([place]) 11/27/2016  . Varicose veins of both lower extremities with pain 11/08/2015  . Tobacco use disorder, moderate, in sustained remission 06/06/2015  . Transient ischemic attack (TIA) 06/06/2015  . Patent arterial duct 06/06/2015  . Superficial thrombophlebitis of leg 06/06/2015    No Known Allergies  Past Surgical History:  Procedure Laterality Date  . COLONOSCOPY  2008  . COLONOSCOPY WITH PROPOFOL N/A 09/09/2018   Procedure: COLONOSCOPY WITH PROPOFOL WITH BIOPSY;  Surgeon: [name], MD;  Location: [facility];  Service: Endoscopy;  Laterality: N/A;  . POLYPECTOMY N/A 09/09/2018   Procedure: POLYPECTOMY;  Surgeon: [name], MD;  Location: [facility];  Service: Endoscopy;  Laterality: N/A;  . VASCULAR SURGERY  03/22/2016  . WISDOM TOOTH EXTRACTION      Social History   Tobacco Use  . Smoking status: Former Smoker    Packs/day: 1.00    Years: 30.00    Pack years: 30.00    Types: Cigarettes    Quit date: 12/22/2013    Years since quitting: 7.4  . Smokeless tobacco: Former Systems developer    Quit date: 11/21/2013  Vaping Use  . Vaping Use: Never used  Substance Use Topics  .  Alcohol use: No    Alcohol/week: 0.0 standard drinks  . Drug use: No     Medication list has been reviewed and updated.  Current Meds  Medication Sig  . escitalopram (LEXAPRO) 10 MG tablet TAKE 1 & 1/2 (ONE & ONE-HALF) TABLETS BY MOUTH ONCE DAILY    PHQ 2/9 Scores 05/27/2021 03/02/2020 05/09/2019 12/30/2018  PHQ - 2 Score 2 2 0 0  PHQ- 9 Score 7 8 - -    GAD 7 : Generalized Anxiety Score 05/27/2021  Nervous, Anxious, on Edge 1  Control/stop worrying 0  Worry too much - different things 0  Trouble relaxing 0  Restless 0  Easily annoyed or irritable 1  Afraid -  awful might happen 0  Total GAD 7 Score 2  Anxiety Difficulty Not difficult at all    BP Readings from Last 3 Encounters:  05/27/21 132/74  03/02/20 98/60  05/28/19 121/84    Physical Exam Vitals and nursing note reviewed.  Constitutional:      General: He is not in acute distress.    Appearance: He is well-developed.  HENT:     Head: Normocephalic and atraumatic.  Neck:     Vascular: No carotid bruit.  Cardiovascular:     Rate and Rhythm: Normal rate and regular rhythm.     Pulses: Normal pulses.  Pulmonary:     Effort: Pulmonary effort is normal. No respiratory distress.     Breath sounds: No wheezing or rhonchi.  Musculoskeletal:     Cervical back: Normal range of motion.  Lymphadenopathy:     Cervical: No cervical adenopathy.  Skin:    General: Skin is warm and dry.     Findings: No rash.  Neurological:     Mental Status: He is alert and oriented to person, place, and time.  Psychiatric:        Mood and Affect: Mood normal.        Behavior: Behavior normal.     Wt Readings from Last 3 Encounters:  05/27/21 202 lb (91.6 kg)  03/02/20 199 lb 6 oz (90.4 kg)  05/28/19 200 lb (90.7 kg)    BP 132/74   Pulse 70   Ht 6' (1.829 m)   Wt 202 lb (91.6 kg)   SpO2 96%   BMI 27.40 kg/m   Assessment and Plan: 1. Mild single current episode of major depressive disorder ([facility]) Increase Lexapro to 15 mg per day Follow up if sx are not improved. He will need to establish with [facility] due to his health insurance. - escitalopram (LEXAPRO) 10 MG tablet; Take 1.5 tablets (15 mg total) by mouth daily.  Dispense: 135 tablet; Refill: 1   Partially dictated using Editor, commissioning. Any errors are unintentional.  Halina Maidens, MD [facility]  05/27/2021

## 2022-01-02 ENCOUNTER — Telehealth: Payer: Self-pay | Admitting: *Deleted

## 2022-01-02 ENCOUNTER — Other Ambulatory Visit: Payer: Self-pay | Admitting: Internal Medicine

## 2022-01-02 DIAGNOSIS — F320 Major depressive disorder, single episode, mild: Secondary | ICD-10-CM

## 2022-01-02 MED ORDER — ESCITALOPRAM OXALATE 10 MG PO TABS: 15.0000 mg | ORAL_TABLET | Freq: Every day | ORAL | 0 refills | Status: AC

## 2022-01-02 NOTE — Telephone Encounter (Signed)
I called pt and made him his 6 month check up appt with Dr. Army Melia for 01/03/2022 at 8:40.   I gave him a 90 day supply of the Lexapro 10 mg.

## 2022-01-02 NOTE — Telephone Encounter (Signed)
Medication Refill - Medication:  escitalopram (LEXAPRO) 10 MG tablet   Has the patient contacted their pharmacy? Yes.   Contact PCP  Preferred Pharmacy (with phone number or street name):  [place], [place] - [place]  [address] [place] [address]  Phone:  [phone]  Fax:  [phone]   Has the patient been seen for an appointment in the last year OR does the patient have an upcoming appointment? Yes.    Agent: Please be advised that RX refills may take up to 3 business days. We ask that you follow-up with your pharmacy.

## 2022-01-02 NOTE — Telephone Encounter (Signed)
Appt made with Dr. Army Melia for 01/03/2022.  Requested Prescriptions  Pending Prescriptions Disp Refills   escitalopram (LEXAPRO) 10 MG tablet 135 tablet 1    Sig: Take 1.5 tablets (15 mg total) by mouth daily.     Psychiatry:  Antidepressants - SSRI Failed - 01/02/2022  8:22 AM      Failed - Valid encounter within last 6 months    Recent Outpatient Visits          7 months ago Mild single current episode of major depressive disorder [facility])   [facility] Glean Hess, MD   1 year ago Annual physical exam   [facility] Glean Hess, MD   2 years ago Sacro-iliac pain   [facility] Glean Hess, MD   3 years ago Annual physical exam   [facility] Glean Hess, MD   4 years ago Annual physical exam   [facility] Glean Hess, MD      Future Appointments            Tomorrow Glean Hess, MD [facility] - Completed PHQ-2 or PHQ-9 in the last 360 days

## 2022-01-03 ENCOUNTER — Other Ambulatory Visit: Payer: Self-pay

## 2022-01-03 ENCOUNTER — Ambulatory Visit (INDEPENDENT_AMBULATORY_CARE_PROVIDER_SITE_OTHER): Payer: BLUE CROSS/BLUE SHIELD | Admitting: Internal Medicine

## 2022-01-03 ENCOUNTER — Encounter: Payer: Self-pay | Admitting: Internal Medicine

## 2022-01-03 VITALS — BP 118/72 | HR 58 | Ht 72.0 in | Wt 209.0 lb

## 2022-01-03 DIAGNOSIS — F320 Major depressive disorder, single episode, mild: Secondary | ICD-10-CM

## 2022-01-03 NOTE — Progress Notes (Signed)
Date:  01/03/2022   Name:  Bob Mills   DOB:  1966-02-05   MRN:  [id number]   Chief Complaint: Anxiety  Anxiety Presents for follow-up visit. Symptoms include decreased concentration and depressed mood. Patient reports no palpitations, panic or suicidal ideas. Symptoms occur occasionally. The severity of symptoms is mild.    Depression        This is a chronic problem.  The problem has been waxing and waning since onset.  Associated symptoms include decreased concentration, decreased interest and appetite change.  Associated symptoms include no suicidal ideas.     The symptoms are aggravated by family issues and work stress.  Past treatments include SSRIs - Selective serotonin reuptake inhibitors (only taking lexapro 10 mg due to concern about dependency).  Past medical history includes anxiety.    Lab Results  Component Value Date   NA 137 03/02/2020   K 4.4 03/02/2020   CO2 21 03/02/2020   GLUCOSE 93 03/02/2020   BUN 9 03/02/2020   CREATININE 0.76 03/02/2020   CALCIUM 9.1 03/02/2020   GFRNONAA 105 03/02/2020   Lab Results  Component Value Date   CHOL 153 03/02/2020   HDL 55 03/02/2020   LDLCALC 89 03/02/2020   TRIG 41 03/02/2020   CHOLHDL 2.8 03/02/2020   Lab Results  Component Value Date   TSH 1.740 03/02/2020   No results found for: HGBA1C Lab Results  Component Value Date   WBC 4.8 03/02/2020   HGB 13.3 03/02/2020   HCT 38.0 03/02/2020   MCV 87 03/02/2020   PLT 225 03/02/2020   Lab Results  Component Value Date   ALT 14 03/02/2020   AST 22 03/02/2020   ALKPHOS 90 03/02/2020   BILITOT 0.3 03/02/2020   No results found for: 25OHVITD2, 25OHVITD3, VD25OH   Review of Systems  Constitutional:  Positive for appetite change.  Cardiovascular:  Negative for palpitations.  Psychiatric/Behavioral:  Positive for decreased concentration and depression. Negative for suicidal ideas.    Patient Active Problem List   Diagnosis Date Noted   Sacro-iliac pain  05/09/2019   Special screening for malignant neoplasms, colon    Benign neoplasm of ascending colon    Benign neoplasm of transverse colon    Polyp of sigmoid colon    Trigger index finger of left hand 12/25/2017   Mild single current episode of major depressive disorder ([place]) 11/27/2016   Varicose veins of both lower extremities with pain 11/08/2015   Tobacco use disorder, moderate, in sustained remission 06/06/2015   Transient ischemic attack (TIA) 06/06/2015   Patent arterial duct 06/06/2015   Superficial thrombophlebitis of leg 06/06/2015    No Known Allergies  Past Surgical History:  Procedure Laterality Date   COLONOSCOPY  2008   COLONOSCOPY WITH PROPOFOL N/A 09/09/2018   Procedure: COLONOSCOPY WITH PROPOFOL WITH BIOPSY;  Surgeon: [name], MD;  Location: [facility];  Service: Endoscopy;  Laterality: N/A;   POLYPECTOMY N/A 09/09/2018   Procedure: POLYPECTOMY;  Surgeon: [name], MD;  Location: [facility];  Service: Endoscopy;  Laterality: N/A;   VASCULAR SURGERY  03/22/2016   WISDOM TOOTH EXTRACTION      Social History   Tobacco Use   Smoking status: Former    Packs/day: 1.00    Years: 30.00    Pack years: 30.00    Types: Cigarettes    Quit date: 12/22/2013    Years since quitting: 8.0   Smokeless tobacco: Former    Quit date:  11/21/2013  Vaping Use   Vaping Use: Never used  Substance Use Topics   Alcohol use: No    Alcohol/week: 0.0 standard drinks   Drug use: No     Medication list has been reviewed and updated.  Current Meds  Medication Sig   escitalopram (LEXAPRO) 10 MG tablet Take 1.5 tablets (15 mg total) by mouth daily.    PHQ 2/9 Scores 01/03/2022 05/27/2021 03/02/2020 05/09/2019  PHQ - 2 Score 4 2 2  0  PHQ- 9 Score 15 7 8  -    GAD 7 : Generalized Anxiety Score 01/03/2022 05/27/2021  Nervous, Anxious, on Edge 0 1  Control/stop worrying 1 0  Worry too much - different things 1 0  Trouble relaxing 0 0  Restless 0 0  Easily  annoyed or irritable 3 1  Afraid - awful might happen 1 0  Total GAD 7 Score 6 2  Anxiety Difficulty - Not difficult at all    BP Readings from Last 3 Encounters:  01/03/22 118/72  05/27/21 132/74  03/02/20 98/60    Physical Exam Vitals and nursing note reviewed.  Constitutional:      General: He is not in acute distress.    Appearance: Normal appearance. He is well-developed.  HENT:     Head: Normocephalic and atraumatic.  Cardiovascular:     Rate and Rhythm: Normal rate and regular rhythm.     Pulses: Normal pulses.     Heart sounds: No murmur heard. Pulmonary:     Effort: Pulmonary effort is normal. No respiratory distress.     Breath sounds: No wheezing or rhonchi.  Musculoskeletal:     Cervical back: Normal range of motion.     Right lower leg: No edema.     Left lower leg: No edema.  Lymphadenopathy:     Cervical: No cervical adenopathy.  Skin:    General: Skin is warm and dry.     Findings: No rash.  Neurological:     Mental Status: He is alert and oriented to person, place, and time.  Psychiatric:        Mood and Affect: Mood normal.        Behavior: Behavior normal.    Wt Readings from Last 3 Encounters:  01/03/22 209 lb (94.8 kg)  05/27/21 202 lb (91.6 kg)  03/02/20 199 lb 6 oz (90.4 kg)    BP 118/72    Pulse (!) 58    Ht 6' (1.829 m)    Wt 209 lb (94.8 kg)    SpO2 97%    BMI 28.35 kg/m   Assessment and Plan: 1. Mild single current episode of major depressive disorder ([place]) Doing fairly well on Lexapro. However, PHQ higher - recommend taking 15 mg Lexapro daily. Rx was refilled yesteday for 90 days which will take him to his appt with his new PCP.   Partially dictated using Editor, commissioning. Any errors are unintentional.  Halina Maidens, MD [facility]  01/03/2022
